# Patient Record
Sex: Male | Born: 1960 | Race: White | Hispanic: No | Marital: Married | Smoking: Former smoker
Health system: Southern US, Community
[De-identification: ages and names within clinical notes are randomized; demographics above are authoritative.]

## PROBLEM LIST (undated history)

## (undated) HISTORY — PX: NASAL ENDOSCOPY: SHX286

## (undated) HISTORY — PX: EYE SURGERY: SHX253

## (undated) HISTORY — PX: FRACTURE SURGERY: SHX138

## (undated) HISTORY — PX: VARICOCELE EXCISION: SUR582

## (undated) HISTORY — PX: ANKLE SURGERY: SHX546

---

## 2005-03-23 ENCOUNTER — Ambulatory Visit: Payer: Self-pay

## 2005-11-21 ENCOUNTER — Ambulatory Visit (HOSPITAL_BASED_OUTPATIENT_CLINIC_OR_DEPARTMENT_OTHER): Admission: RE | Admit: 2005-11-21 | Discharge: 2005-11-21 | Payer: Self-pay | Admitting: Orthopedic Surgery

## 2006-05-10 ENCOUNTER — Ambulatory Visit (HOSPITAL_COMMUNITY): Admission: RE | Admit: 2006-05-10 | Discharge: 2006-05-10 | Payer: Self-pay | Admitting: Orthopedic Surgery

## 2006-07-10 ENCOUNTER — Ambulatory Visit (HOSPITAL_BASED_OUTPATIENT_CLINIC_OR_DEPARTMENT_OTHER): Admission: RE | Admit: 2006-07-10 | Discharge: 2006-07-10 | Payer: Self-pay | Admitting: Orthopedic Surgery

## 2009-11-27 ENCOUNTER — Emergency Department: Payer: Self-pay | Admitting: Emergency Medicine

## 2009-12-05 ENCOUNTER — Emergency Department: Payer: Self-pay | Admitting: Internal Medicine

## 2015-11-10 ENCOUNTER — Emergency Department
Admission: EM | Admit: 2015-11-10 | Discharge: 2015-11-10 | Disposition: A | Discharge status: Home / Self Care | Payer: Medicaid Other | Attending: Emergency Medicine | Admitting: Emergency Medicine

## 2015-11-10 ENCOUNTER — Emergency Department: Payer: Medicaid Other

## 2015-11-10 DIAGNOSIS — G44009 Cluster headache syndrome, unspecified, not intractable: Secondary | ICD-10-CM | POA: Insufficient documentation

## 2015-11-10 DIAGNOSIS — Z87891 Personal history of nicotine dependence: Secondary | ICD-10-CM | POA: Diagnosis not present

## 2015-11-10 DIAGNOSIS — G43909 Migraine, unspecified, not intractable, without status migrainosus: Secondary | ICD-10-CM | POA: Diagnosis present

## 2015-11-10 MED ORDER — METOCLOPRAMIDE HCL 10 MG PO TABS: 10.0000 mg | ORAL_TABLET | Freq: Three times a day (TID) | ORAL | Status: DC | PRN

## 2015-11-10 MED ORDER — METOCLOPRAMIDE HCL 5 MG/ML IJ SOLN
10.0000 mg | Freq: Once | INTRAMUSCULAR | Status: AC
  Administered 2015-11-10: 10 mg via INTRAVENOUS
  Filled 2015-11-10: qty 2

## 2015-11-10 MED ORDER — SODIUM CHLORIDE 0.9 % IV BOLUS (SEPSIS)
1000.0000 mL | Freq: Once | INTRAVENOUS | Status: AC
  Administered 2015-11-10: 1000 mL via INTRAVENOUS

## 2015-11-10 MED ORDER — KETOROLAC TROMETHAMINE 30 MG/ML IJ SOLN
15.0000 mg | Freq: Once | INTRAMUSCULAR | Status: AC
  Administered 2015-11-10: 15 mg via INTRAVENOUS
  Filled 2015-11-10: qty 1

## 2015-11-10 NOTE — Discharge Instructions (Signed)
Cluster Headache Cluster headaches are recognized by their pattern of deep, intense head pain. They normally occur on one side of your head, but they may "switch sides" in subsequent episodes. Typically, cluster headaches:   Are severe in nature.   Occur repeatedly over weeks to months and are followed by periods of no headaches.   Can last from 15 minutes to 3 hours.   Occur at the same time each day, often at night.   Occur several times a day. CAUSES The exact cause of cluster headaches is not known. Alcohol use may be associated with cluster headaches. SIGNS AND SYMPTOMS   Severe pain that begins in or around your eye or temple.   One-sided head pain.   Feeling sick to your stomach (nauseous).   Sensitivity to light.   Runny nose.   Eye redness, tearing, and nasal stuffiness on the side of your head where you are experiencing pain.   Sweaty, pale skin of the face.   Droopy or swollen eyelid.   Restlessness. DIAGNOSIS  Cluster headaches are diagnosed based on symptoms and a physical exam. Your health care provider may order a CT scan or an MRI of your head or lab tests to see if your headaches are caused by other medical conditions.  TREATMENT   Medicines for pain relief and to prevent recurrent attacks. Some people may need a combination of medicines.  Oxygen for pain relief.   Biofeedback programs to help reduce headache pain.  It may be helpful to keep a headache diary. This may help you find a trend for what is triggering your headaches. Your health care provider can develop a treatment plan.  HOME CARE INSTRUCTIONS  During cluster periods:   Follow a regular sleep schedule. Do not vary the amount and time that you sleep from day to day. It is important to stay on the same schedule during a cluster period to help prevent headaches.   Avoid alcohol.   Stop smoking if you smoke.  SEEK MEDICAL CARE IF:  You have any changes from your previous  cluster headaches either in intensity or frequency.   You are not getting relief from medicines you are taking.  SEEK IMMEDIATE MEDICAL CARE IF:   You faint.   You have weakness or numbness, especially on one side of your body or face.   You have double vision.   You have nausea or vomiting that is not relieved within several hours.   You cannot keep your balance or have difficulty talking or walking.   You have neck pain or stiffness.   You have a fever. MAKE SURE YOU:  Understand these instructions.   Will watch your condition.   Will get help right away if you are not doing well or get worse.   This information is not intended to replace advice given to you by your health care provider. Make sure you discuss any questions you have with your health care provider.   Document Released: 04/24/2005 Document Revised: 02/12/2013 Document Reviewed: 11/14/2012 Elsevier Interactive Patient Education [employer]2016 [employer]  Please return immediately if condition worsens. Please contact her primary physician or the physician you were given for referral. If you have any specialist physicians involved in her treatment and plan please also contact them. Thank you for using [facility].

## 2015-11-10 NOTE — ED Notes (Signed)
[facility] with c/o migraine for the past couple of hours, states he has them multiple times a week and normally takes IBU for them.. States he feels a change in the shape of the indentations of his head

## 2015-11-10 NOTE — ED Provider Notes (Signed)
Time Seen: Approximately 1210 I have reviewed the triage notes  Chief Complaint: Migraine   History of Present Illness: Stephen Ryan is a 55 y.o. male who presents with a headache that he describes as a migraine headache. He states he's had similar headaches before in the past. He states normally takes ibuprofen and they dissipate. He's had some feelings of nausea. He still describes a sensation where he feels like the shape of the indentations of his head and have changed over the last 48 hours. Patient denies any trauma or focal weakness. He does have typical photophobia. He states he's had previous eye surgery and denies any acute visual disturbances. He denies any neck pain or difficulty with ambulation  History reviewed. No pertinent past medical history.  There are no active problems to display for this patient.   Past Surgical History  Procedure Laterality Date  . Eye surgery    . Nasal endoscopy    . Varicocele excision    . Fracture surgery      Past Surgical History  Procedure Laterality Date  . Eye surgery    . Nasal endoscopy    . Varicocele excision    . Fracture surgery      Current Outpatient Rx  Name  Route  Sig  Dispense  Refill  . metoCLOPramide (REGLAN) 10 MG tablet   Oral   Take 1 tablet (10 mg total) by mouth every 8 (eight) hours as needed for nausea.   30 tablet   1     Allergies:  Review of patient's allergies indicates no known allergies.  Family History: No family history on file.  Social History: Social History  Substance Use Topics  . Smoking status: Former Games developermoker  . Smokeless tobacco: None  . Alcohol Use: Yes     Review of Systems:   10 point review of systems was performed and was otherwise negative:  Constitutional: No fever Eyes: No visual disturbances ENT: No sore throat, ear pain Cardiac: No chest pain Respiratory: No shortness of breath, wheezing, or stridor Abdomen: No abdominal pain, no vomiting, No  diarrhea Endocrine: No weight loss, No night sweats Extremities: No peripheral edema, cyanosis Skin: No rashes, easy bruising Neurologic: No focal weakness, trouble with speech or swollowing Urologic: No dysuria, Hematuria, or urinary frequency   Physical Exam:  ED Triage Vitals  Enc Vitals Group     BP 11/10/15 1142 137/91 mmHg     Pulse Rate 11/10/15 1142 77     Resp 11/10/15 1142 18     Temp 11/10/15 1142 98.3 F (36.8 C)     Temp Source 11/10/15 1142 Oral     SpO2 11/10/15 1142 97 %     Weight 11/10/15 1142 225 lb (102.059 kg)     Height 11/10/15 1142 6' (1.829 m)     Head Cir --      Peak Flow --      Pain Score 11/10/15 1142 8     Pain Loc --      Pain Edu? --      Excl. in GC? --     General: Awake , Alert , and Oriented times 3; GCS 15 Head: Normal cephalic , atraumatic Eyes: Pupils equal , round, reactive to light Nose/Throat: No nasal drainage, patent upper airway without erythema or exudate.  Neck: Supple, Full range of motion, No anterior adenopathy or palpable thyroid masses Lungs: Clear to ascultation without wheezes , rhonchi, or rales Heart: Regular rate, regular rhythm  without murmurs , gallops , or rubs Abdomen: Soft, non tender without rebound, guarding , or rigidity; bowel sounds positive and symmetric in all 4 quadrants. No organomegaly .        Extremities: 2 plus symmetric pulses. No edema, clubbing or cyanosis Neurologic: normal ambulation, Motor symmetric without deficits, sensory intact Skin: warm, dry, no rashes   Radiology: *   CT HEAD WO CONTRAST (Final result) Result time: 11/10/15 13:14:16   Final result by Rad Results In Interface (11/10/15 13:14:16)   Narrative:   CLINICAL DATA: Headaches, severe and frequent  EXAM: CT HEAD WITHOUT CONTRAST  TECHNIQUE: Contiguous axial images were obtained from the base of the skull through the vertex without intravenous contrast.  COMPARISON: None.  FINDINGS: Brain: The ventricles are  normal in size and configuration. The right lateral ventricle is slightly larger than the left lateral ventricle, an anatomic variant. There is no intracranial mass, hemorrhage, extra-axial fluid collection, or midline shift. Gray-white compartments appear normal. No acute infarct evident.  Vascular: No vascular lesions are evident.  Skull: Bony calvarium appears intact.  Sinuses/Orbits: Visualized orbits appear symmetric bilaterally. Visualized paranasal sinuses are clear.  Other: Mastoid air cells are clear.  IMPRESSION: Study within normal limits.   Electronically Signed By: Bretta BangWilliam Woodruff III M.D. On: 11/10/2015 13:14         I personally reviewed the radiologic studies   ED Course: * Patient was given IV fluids, IV Reglan, and IV Toradol. The patient had little symptomatic relief, but declines on any other pain medication. Patient was advised to return here if there is a change in his headache pattern or focal weakness. He was referred back to his primary physician. I felt this was unlikely to be a life-threatening cause for his headache such as intracerebral masses, meningitis, encephalitis, cavernous venous thrombosis, etc.    Assessment:  Cluster headache  Final Clinical Impression:  Final diagnoses:  Cluster headache, not intractable, unspecified chronicity pattern     Plan:  Outpatient management Prescription for Reglan. (Patient declined on narcotic pain medication) Patient was advised to return immediately if condition worsens. Patient was advised to follow up with their primary care physician or other specialized physicians involved in their outpatient care. The patient and/or family member/power of attorney had laboratory results reviewed at the bedside. All questions and concerns were addressed and appropriate discharge instructions were distributed by the nursing staff.            Jennye MoccasinBrian S Quigley, MD 11/10/15 [phone]

## 2016-09-10 ENCOUNTER — Encounter: Payer: Self-pay | Admitting: Emergency Medicine

## 2016-09-10 ENCOUNTER — Emergency Department: Payer: Medicaid Other

## 2016-09-10 ENCOUNTER — Emergency Department
Admission: EM | Admit: 2016-09-10 | Discharge: 2016-09-11 | Disposition: A | Discharge status: Home / Self Care | Payer: Medicaid Other | Attending: Emergency Medicine | Admitting: Emergency Medicine

## 2016-09-10 DIAGNOSIS — M792 Neuralgia and neuritis, unspecified: Secondary | ICD-10-CM

## 2016-09-10 DIAGNOSIS — M25512 Pain in left shoulder: Secondary | ICD-10-CM | POA: Insufficient documentation

## 2016-09-10 DIAGNOSIS — Z87891 Personal history of nicotine dependence: Secondary | ICD-10-CM | POA: Insufficient documentation

## 2016-09-10 MED ORDER — ORPHENADRINE CITRATE 30 MG/ML IJ SOLN
60.0000 mg | Freq: Two times a day (BID) | INTRAMUSCULAR | Status: DC
  Administered 2016-09-10: 60 mg via INTRAMUSCULAR
  Filled 2016-09-10: qty 2

## 2016-09-10 MED ORDER — METHYLPREDNISOLONE SODIUM SUCC 125 MG IJ SOLR
125.0000 mg | Freq: Once | INTRAMUSCULAR | Status: AC
  Administered 2016-09-10: 125 mg via INTRAMUSCULAR
  Filled 2016-09-10: qty 2

## 2016-09-10 NOTE — ED Notes (Signed)
Pt says pain started about one week ago and is steadily worsening; pain at rest rated 1/10; with movement, 10/10; pt says left arm feels weak and at times he has go support it with his right arm;

## 2016-09-10 NOTE — ED Triage Notes (Signed)
Pt reports having pain in the left shoulder, shoulder blade, pain when he turns his neck to the right for the past week, pt denies injury, pt reports that occasionally when the pain is at its worse he has tingling in the left hand. Cap refill within normal limits, radial pulse palpated. Pt states that he is able to get some relief when he lays back and props the left arm up with pillows, pt also reports that this is the hand and arm used to hold  His cane and support himself

## 2016-09-11 MED ORDER — PREDNISONE 10 MG (21) PO TBPK: ORAL_TABLET | Freq: Every day | ORAL | 0 refills | Status: AC

## 2016-09-11 NOTE — ED Provider Notes (Signed)
[facility] Emergency Department Provider Note  ____________________________________________  Time seen: Approximately 4:09 PM  I have reviewed the triage vital signs and the nursing notes.   HISTORY  Chief Complaint Arm Pain    HPI Stephen Ryan is a 56 y.o. male presenting to the emergency department with 6/10 left shoulder pain that radiates to the left fingers for approximately 1 week. Patient states that he has a history of neck pain. Patient states that his symptoms are reproduced with lateral rotation at the neck. No falls or mechanisms of trauma. Patient denies weakness. Patient denies bowel or bladder incontinence. No alleviating measures have been undertaken.   History reviewed. No pertinent past medical history.  There are no active problems to display for this patient.   Past Surgical History:  Procedure Laterality Date  . ANKLE SURGERY Right    cartilage damage  . EYE SURGERY    . FRACTURE SURGERY    . NASAL ENDOSCOPY    . VARICOCELE EXCISION      Prior to Admission medications   Medication Sig Start Date End Date Taking? Authorizing Provider  predniSONE (STERAPRED UNI-PAK 21 TAB) 10 MG (21) TBPK tablet Take by mouth daily. Take 6 tablets the first day, take 5 tablets the second day, take 4 tablets the third day, take 3 tablets the fourth day, take 2 tablets the fifth day, take 1 tablet the sixth day. 09/11/16   Orvil FeilWoods, Mikal Blasdell M, PA-C    Allergies Patient has no known allergies.  History reviewed. No pertinent family history.  Social History Social History  Substance Use Topics  . Smoking status: Former Games developermoker  . Smokeless tobacco: Never Used  . Alcohol use Yes    Review of Systems  Constitutional: No fever/chills Eyes: No visual changes. No discharge ENT: No upper respiratory complaints. Cardiovascular: no chest pain. Respiratory: no cough. No SOB. Gastrointestinal: No abdominal pain.  No nausea, no vomiting.  No diarrhea.   No constipation. Musculoskeletal: Patient has left shoulder pain radiating into the left hand. Skin: Negative for rash, abrasions, lacerations, ecchymosis. Neurological: Negative for headaches, focal weakness or numbness.  ____________________________________________   PHYSICAL EXAM:  VITAL SIGNS: ED Triage Vitals  Enc Vitals Group     BP 09/10/16 2211 (!) 163/79     Pulse Rate 09/10/16 2211 73     Resp 09/10/16 2211 18     Temp 09/10/16 2211 98.2 F (36.8 C)     Temp Source 09/10/16 2211 Oral     SpO2 09/10/16 2211 98 %     Weight 09/10/16 2211 220 lb (99.8 kg)     Height 09/10/16 2211 6\' 1"  (1.854 m)     Head Circumference --      Peak Flow --      Pain Score 09/10/16 2210 10     Pain Loc --      Pain Edu? --      Excl. in GC? --      Constitutional: Alert and oriented. Well appearing and in no acute distress. Eyes: Conjunctivae are normal. PERRL. EOMI. Head: Atraumatic.  Neck: Full range of motion. Patient's left shoulder pain is reproduced with range of motion at the neck. Cardiovascular: Normal rate, regular rhythm. Normal S1 and S2.  Good peripheral circulation. Respiratory: Normal respiratory effort without tachypnea or retractions. Lungs CTAB. Good air entry to the bases with no decreased or absent breath sounds. Musculoskeletal: Patient has 5/5 strength in the upper and lower extremities bilaterally. Full range of  motion at the shoulder, elbow and wrist bilaterally. Full range of motion at the hip, knee and ankle bilaterally. No changes in gait.   Neurologic:  Normal speech and language. No gross focal neurologic deficits are appreciated. Cranial nerves: 2-10 normal as tested. Cerebellar: Finger-nose-finger WNL, heel to shin WNL. Sensorimotor: No sensory loss or abnormal reflexes. Vision: No visual field deficts noted to confrontation.  Speech: No dysarthria or expressive aphasia.  Skin:  Skin is warm, dry and intact. No rash noted. Psychiatric: Mood and affect  are normal. Speech and behavior are normal. Patient exhibits appropriate insight and judgement.   ____________________________________________   LABS (all labs ordered are listed, but only abnormal results are displayed)  Labs Reviewed - No data to display ____________________________________________  EKG   ____________________________________________  RADIOLOGY Geraldo Pitter, personally viewed and evaluated these images (plain radiographs) as part of my medical decision making, as well as reviewing the written report by the radiologist.  Dg Cervical Spine 2-3 Views  Result Date: 09/10/2016 CLINICAL DATA:  LEFT shoulder pain of when turning head for week, no injury. EXAM: CERVICAL SPINE - 2-3 VIEW COMPARISON:  None. FINDINGS: There is no evidence of cervical spine fracture or prevertebral soft tissue swelling. Intervertebral disc heights preserved with multilevel mild ventral endplate spurring. Lateral masses in alignment. Alignment is normal. Straightened cervical lordosis. No other significant bone abnormalities are identified. IMPRESSION: No fracture deformity, malalignment nor advanced degenerative change for age. Electronically Signed   By: Awilda Metro M.D.   On: 09/10/2016 23:26    ____________________________________________    PROCEDURES  Procedure(s) performed:    Procedures    Medications  methylPREDNISolone sodium succinate (SOLU-MEDROL) 125 mg/2 mL injection 125 mg (125 mg Intramuscular Given 09/10/16 2330)     ____________________________________________   INITIAL IMPRESSION / ASSESSMENT AND PLAN / ED COURSE  Pertinent labs & imaging results that were available during my care of the patient were reviewed by me and considered in my medical decision making (see chart for details).  Review of the [place] CSRS was performed in accordance of the [facility] prior to dispensing any controlled drugs.     Assessment and plan: Left shoulder pain:  Patient  presents to the emergency department with left shoulder pain that radiates to the left hand. Patient's symptoms were reproduced with range of motion at the neck. I suspect referred pain from the neck. DG cervical spine revealed no acute bony abnormality or advanced degenerative changes. Patient was given an injection of Solu-Medrol in the emergency department. He was discharged with tapered prednisone. Vital signs and physical exam are reassuring. All patient questions were answered.   ____________________________________________  FINAL CLINICAL IMPRESSION(S) / ED DIAGNOSES  Final diagnoses:  Radicular pain of left upper extremity      NEW MEDICATIONS STARTED DURING THIS VISIT:  Discharge Medication List as of 09/11/2016 12:08 AM    START taking these medications   Details  predniSONE (STERAPRED UNI-PAK 21 TAB) 10 MG (21) TBPK tablet Take by mouth daily. Take 6 tablets the first day, take 5 tablets the second day, take 4 tablets the third day, take 3 tablets the fourth day, take 2 tablets the fifth day, take 1 tablet the sixth day., Starting Mon 09/11/2016, Print            This chart was dictated using voice recognition software/Dragon. Despite best efforts to proofread, errors can occur which can change the meaning. Any change was purely unintentional.    Orvil Feil,  PA-C 09/11/16 1628    Rockne Menghini, MD 09/11/16 2244

## 2016-10-03 ENCOUNTER — Ambulatory Visit
Admission: RE | Admit: 2016-10-03 | Discharge: 2016-10-03 | Disposition: A | Discharge status: Home / Self Care | Payer: Medicaid Other | Source: Ambulatory Visit | Attending: Chiropractic Medicine | Admitting: Chiropractic Medicine

## 2016-10-03 ENCOUNTER — Other Ambulatory Visit: Payer: Self-pay | Admitting: Chiropractic Medicine

## 2016-10-03 DIAGNOSIS — M5030 Other cervical disc degeneration, unspecified cervical region: Secondary | ICD-10-CM | POA: Insufficient documentation

## 2016-10-03 DIAGNOSIS — M4802 Spinal stenosis, cervical region: Secondary | ICD-10-CM | POA: Insufficient documentation

## 2016-10-03 DIAGNOSIS — M47812 Spondylosis without myelopathy or radiculopathy, cervical region: Secondary | ICD-10-CM

## 2016-11-30 ENCOUNTER — Ambulatory Visit: Payer: Medicaid Other | Attending: Family Medicine

## 2016-11-30 DIAGNOSIS — M542 Cervicalgia: Secondary | ICD-10-CM | POA: Diagnosis not present

## 2016-11-30 DIAGNOSIS — M6281 Muscle weakness (generalized): Secondary | ICD-10-CM

## 2016-11-30 DIAGNOSIS — M5412 Radiculopathy, cervical region: Secondary | ICD-10-CM | POA: Insufficient documentation

## 2016-11-30 NOTE — Therapy (Signed)
[facility] [address], [place], [address] Phone: [phone]   Fax:  [phone]  Physical Therapy Evaluation  Patient Details  Name: Stephen Ryan MRN: [id number] Date of Birth: 04-May-1961 Referring Provider: Cruzita Lederer  Encounter Date: 11/30/2016      PT End of Session - 11/30/16 1536    Visit Number 1   Number of Visits 12   Date for PT Re-Evaluation 01/11/17   Authorization Type [employer]   PT Start Time 1430   PT Stop Time 1530   PT Time Calculation (min) 60 min   Activity Tolerance Patient tolerated treatment well   Behavior During Therapy [facility] for tasks assessed/performed      History reviewed. No pertinent past medical history.  Past Surgical History:  Procedure Laterality Date  . ANKLE SURGERY Right    cartilage damage  . EYE SURGERY    . FRACTURE SURGERY    . NASAL ENDOSCOPY    . VARICOCELE EXCISION      There were no vitals filed for this visit.       Subjective Assessment - 11/30/16 1440    Subjective Patient reports increased L sided cervical pain with symptoms raidiating into the L arm to mid forearm. Patient reports increase in pain with typing, driving, raising his arm,, moving his head (mostly to the left), and manipulation of small objects. Patient reports pain has been around the same since the onset. 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 Patient reports he uses a cane secondary to an ankle surgery he experienced a couple of years ago. Patient reports his pain is worse throughout the day. Patient reports he is under stress with not working and being able to help support his family financially. Patient reports he has a history of headaches (independent from neck pain).     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 Yes   Pain Score 4   Worst: 7/10; 3/10 at best   Pain Location Neck   Pain Orientation Left   Pain Descriptors / Indicators Aching;Radiating   Pain Type Acute pain   Pain Radiating Towards Down the mid forearm   Pain Onset More than a month ago   Pain Frequency Constant            [facility] PT Assessment - 11/30/16 1434      Assessment   Medical Diagnosis L Cervical Radiculopathy   Referring Provider Celine Mans Erin   Onset Date/Surgical Date 09/09/16   Hand Dominance Right   Next MD Visit unknown    Prior Therapy yes - ankle     Balance Screen   Has the patient fallen in the past 6 months No   Has the patient had a decrease in activity level because of a fear of falling?  No   Is the patient reluctant to leave their home because of a fear of falling?  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   Leisure bicycle riding, hiking     Cognition  Overall Cognitive Status Within Functional Limits for tasks assessed     Observation/Other Assessments   Observations Increased UT guarding on the L at rest   Other Surveys  Other Surveys   Neck Disability Index  52%     Sensation   Light Touch Impaired by gross assessment   Additional Comments Decreased Sensation on L UE: C3-C7, T1     Posture/Postural Control   Posture Comments Increased forward head posture     ROM / Strength   AROM / PROM / Strength Strength;AROM     AROM   AROM Assessment Site Cervical;Thoracic   Cervical Flexion 45   Cervical Extension 45   Cervical - Right Side Bend 20   Cervical - Left Side Bend 40   Cervical - Right Rotation 50   Cervical - Left Rotation 20   Thoracic Flexion WNL   Thoracic Extension 75% limited - pain      Strength   Strength Assessment Site Wrist;Shoulder;Elbow;Cervical   Right/Left Shoulder Right;Left   Right Shoulder ABduction 5/5   Left Shoulder ABduction 4-/5   Right/Left Elbow Right;Left   Right Elbow Flexion 5/5   Right Elbow Extension 5/5   Left Elbow Flexion 4-/5   Left Elbow Extension 4-/5   Right/Left Wrist Left;Right   Right Wrist Flexion 5/5   Right Wrist Extension 5/5   Left Wrist Flexion 4/5   Left Wrist Extension 4/5   Cervical Flexion 4+/5   Cervical Extension 4/5   Cervical - Right Side Bend 5/5   Cervical - Left Side Bend 4/5   Cervical - Right Rotation 4/5   Cervical - Left Rotation 4-/5     Palpation   Spinal mobility Increased hypomobility and pain with C5-T6 centrally and unilaterally   Palpation comment Increased TTP along scalenes and UT     Special Tests    Special Tests Cervical   Cervical Tests Spurling's;Dictraction     Spurling's   Findings Positive   Side Left   Comment pain     Distraction Test   Findngs Positive   side Left   Comment decrease in pain      Objective measurements completed on examination: See above findings.   HEP:  Scapular/cervical retraction in standing -- x15 Cervical lateral flexion isometrics -- x 15  Patient demonstrates improvement of L cervical rotation from 20 to 45deg at end of session        PT Education - 11/30/16 1534    Education provided Yes   Education Details HEP: cervical retraction, sidebending isometrics, scapular retraction   Person(s) Educated Patient   Methods Explanation;Demonstration   Comprehension Verbalized understanding;Returned demonstration             PT Long Term Goals - 11/30/16 1544      PT LONG TERM GOAL #1   Title Patient will be independent with HEP to continue benefits of therapy after discharge.   Baseline Dependent with form/technique   Time 6   Period Weeks   Status New     PT LONG TERM GOAL #2   Title Patient will improve NDI to under 20% to  indicate significant improvement with neck pain with performing functional tasks   Baseline 52% NDI   Time 6   Period Weeks   Status New     PT LONG TERM GOAL #3   Title Patient will be able to type pain free for >1 hour or greater be able to perform occupational tasks.  Baseline Unable to type without pain   Time 6   Period Weeks   Status New                Plan - 11/30/16 1536    Clinical Impression Statement Patient is a 56 yo right hand dominant male presenting with increased L sided neck pain with radiating symptoms down the L UE from idiopathetic onset. Patient's cervical dysfunction is indicated by increased NDI score and postive spurlings/distraction test on the affected side. Patient also demonstrate increased weakness throuhgout the L UE indicating increased cervical dysfunction. Patient will benefit from further skilled therapy focused on improving muscular strength, coordination, and endurance to return to prior level of function.    History and Personal Factors relevant to plan of care: History of neck pain   Clinical Presentation Evolving   Clinical Presentation due to: NO increase of pain   Clinical Decision Making Low   Rehab Potential Good   Clinical Impairments Affecting Rehab Potential (+) highly motivated (-) chronic pain   PT Frequency 2x / week   PT Duration 6 weeks   PT Treatment/Interventions Iontophoresis 4mg /ml Dexamethasone;Ultrasound;Moist Heat;Traction;Cryotherapy;Microbiologistlectrical Stimulation;Balance training;Therapeutic exercise;Therapeutic activities;Functional mobility training;Neuromuscular re-education;Patient/family education;Manual techniques;Dry needling   PT Next Visit Plan Progress neck strengthening and improving coordination   Consulted and Agree with Plan of Care Patient      Patient will benefit from skilled therapeutic intervention in order to improve the following deficits and impairments:  Decreased endurance, Pain, Impaired sensation,  Increased fascial restricitons, Decreased mobility, Decreased coordination, Increased muscle spasms, Decreased strength, Decreased range of motion, Hypomobility  Visit Diagnosis: Cervicalgia - Plan: PT plan of care cert/re-cert  Muscle weakness (generalized) - Plan: PT plan of care cert/re-cert  Radiculopathy, cervical region - Plan: PT plan of care cert/re-cert     Problem List There are no active problems to display for this patient.   Myrene GalasWesley Mita Vallo, PT DPT 11/30/2016, 4:03 PM  [facility] [address] [place], [name], [address] Phone: [phone]   Fax:  [phone]  Name: Kennieth FrancoisKenneth Vanwinkle MRN: [id number] Date of Birth: 12/23/1960

## 2016-12-19 ENCOUNTER — Ambulatory Visit: Payer: Medicaid Other | Attending: Family Medicine

## 2016-12-19 DIAGNOSIS — M6281 Muscle weakness (generalized): Secondary | ICD-10-CM | POA: Diagnosis present

## 2016-12-19 DIAGNOSIS — M542 Cervicalgia: Secondary | ICD-10-CM | POA: Diagnosis present

## 2016-12-19 NOTE — Therapy (Signed)
[facility] [address] [place], [name], [address] Phone: [phone]   Fax:  [phone]  Physical Therapy Treatment  Patient Details  Name: Stephen Ryan MRN: [id number] Date of Birth: 01/14/1961 Referring Provider: Cruzita LedererJudson, Melissa Erin  Encounter Date: 12/19/2016      PT End of Session - 12/19/16 1606    Visit Number 2   Number of Visits 12   Date for PT Re-Evaluation 01/11/17   Authorization Type [employer]   PT Start Time 1515   PT Stop Time 1600   PT Time Calculation (min) 45 min   Activity Tolerance Patient tolerated treatment well   Behavior [facility] assessed/performed      History reviewed. No pertinent past medical history.  Past Surgical History:  Procedure Laterality Date  . ANKLE SURGERY Right    cartilage damage  . EYE SURGERY    . FRACTURE SURGERY    . NASAL ENDOSCOPY    . VARICOCELE EXCISION      There were no vitals filed for this visit.      Subjective Assessment - 12/19/16 1519    Subjective Patient reports he continues to have increased neck pain. Patient reports pain on the top of the shoulder/neck has been getting a little worse. Patient states he's currently not driving for long distances secondary to increased neck pain.    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 Yes   Pain Score 5    Pain Location Neck   Pain Orientation Left   Pain Descriptors / Indicators Aching   Pain Type Acute pain   Pain Onset More than a month ago      TREATMENT  Manual Therapy:  STM to patient's cervical extensors, upper trap, thoracic extensors, and scapular retractors with patient positioned in prone to decrease pain and spasms. A-P mobilizations C2-T6 central and  unilaterally on the L side - 2 x 30sec   Therapeutic Exercise:  Seated scapular retraction in sitting - x20  Seated cervical retraction - x10 Seated isometric cervical side bending isometrics - x 10 3 sec holds Thoracic Extension over the towel - 2x 10 Prayer stretch with Clear physioball -- x20 with deep inspiration at end of position Standing scapular retraction with RTB - x20  Patient demonstrates no increase in pain throughout session        PT Education - 12/19/16 1605    Education provided Yes   Education Details HEP: scapular B shoulder ER with scapular retraction   Person(s) Educated Patient   Methods Explanation;Demonstration   Comprehension Verbalized understanding;Returned demonstration             PT Long Term Goals - 11/30/16 1544      PT LONG TERM GOAL #1   Title Patient will be independent with HEP to continue benefits of therapy after discharge.   Baseline Dependent with form/technique   Time 6   Period Weeks   Status New     PT LONG TERM GOAL #2   Title Patient will improve NDI to under 20% to indicate significant improvement with neck pain with performing functional tasks   Baseline 52% NDI   Time 6   Period Weeks   Status New     PT LONG TERM  GOAL #3   Title Patient will be able to type pain free for >1 hour or greater be able to perform occupational tasks.    Baseline Unable to type without pain   Time 6   Period Weeks   Status New               Plan - 12/19/16 1609    Clinical Impression Statement Patient demonstrates increased aggravation upon palpation  (central, L unilateral P-As) along the thoracic spine which reproduced symptoms indicating thoracic/cervical dysfunction. Patient demonstrates no increase in symptoms throughout session but demonstrates increased muscular fatigue at end of session indicating decreased muscular endurance. Patient will benefit from further skilled therapy to return to prior level of function.      Rehab Potential Good   Clinical Impairments Affecting Rehab Potential (+) highly motivated (-) chronic pain   PT Frequency 2x / week   PT Duration 6 weeks   PT Treatment/Interventions Iontophoresis 4mg /ml Dexamethasone;Ultrasound;Moist Heat;Traction;Cryotherapy;Microbiologist;Therapeutic exercise;Therapeutic activities;Functional mobility training;Neuromuscular re-education;Patient/family education;Manual techniques;Dry needling   PT Next Visit Plan Progress neck strengthening and improving coordination   Consulted and Agree with Plan of Care Patient      Patient will benefit from skilled therapeutic intervention in order to improve the following deficits and impairments:  Decreased endurance, Pain, Impaired sensation, Increased fascial restricitons, Decreased mobility, Decreased coordination, Increased muscle spasms, Decreased strength, Decreased range of motion, Hypomobility  Visit Diagnosis: Cervicalgia  Muscle weakness (generalized)     Problem List There are no active problems to display for this patient.   Myrene Galas, PT DPT 12/19/2016, 4:43 PM  [facility] SPORTS MEDICINE [address], [place], [address] Phone: [phone]   Fax:  [phone]  Name: Stephen Ryan MRN: [id number] Date of Birth: 04-Nov-1960

## 2017-01-11 ENCOUNTER — Ambulatory Visit: Payer: Medicaid Other | Attending: Family Medicine

## 2017-01-11 DIAGNOSIS — M5412 Radiculopathy, cervical region: Secondary | ICD-10-CM | POA: Insufficient documentation

## 2017-01-11 DIAGNOSIS — M542 Cervicalgia: Secondary | ICD-10-CM | POA: Diagnosis present

## 2017-01-11 DIAGNOSIS — M6281 Muscle weakness (generalized): Secondary | ICD-10-CM | POA: Insufficient documentation

## 2017-01-11 NOTE — Therapy (Signed)
[facility] [address] [place], [name], [address] Phone: [phone]   Fax:  [phone]  Physical Therapy Treatment  Patient Details  Name: Stephen Ryan MRN: [id number] Date of Birth: 08/20/1960 Referring Provider: Cruzita LedererJudson, Melissa Erin  Encounter Date: 01/11/2017      PT End of Session - 01/11/17 1312    Visit Number 3   Number of Visits 12   Date for PT Re-Evaluation 01/11/17   Authorization Type [employer]   PT Start Time 1300   PT Stop Time 1345   PT Time Calculation (min) 45 min   Activity Tolerance Patient tolerated treatment well   Behavior [facility] Therapy [facility] for tasks assessed/performed      History reviewed. No pertinent past medical history.  Past Surgical History:  Procedure Laterality Date  . ANKLE SURGERY Right    cartilage damage  . EYE SURGERY    . FRACTURE SURGERY    . NASAL ENDOSCOPY    . VARICOCELE EXCISION      There were no vitals filed for this visit.      Subjective Assessment - 01/11/17 1108    Subjective Patient reports he began driving and using laptop has increased neck pain when performing. Patient reports he can drive for 20min before drastic increase in pain. Patient reports he can use laptop for 15-20 min before increase in pain.    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 Yes   Pain Score 5    Pain Location Neck   Pain Orientation Left;Right   Pain Descriptors / Indicators Aching   Pain Type Acute pain   Pain Onset More than a month ago   Pain Frequency Constant        TREATMENT  Manual Therapy:  STM to patient's cervical extensors, upper trap, thoracic extensors, and scapular retractors with patient positioned in prone to decrease pain and spasms. P-A  mobilizations C2-C7 central and unilaterally on the L side - 2 x 30sec with patient in supine    Therapeutic Exercise:   Prayer stretch with Clear physioball -- x20 with deep inspiration at end of position Cervical Isometrics in standing - x25 with 3 sec holds SNAGs for Rotation - x 15 B Self-inferior mobilization with a towel to 1st rib on the L - x10  Thoracic rotation in sitting - x 15 B  Isometrics cervical retraction in standing -- x15 Isometrics lateral side bending against wall  -- x 15 B    Patient demonstrates no increase in pain throughout session       PT Education - 01/11/17 1143    Education provided Yes   Education Details HEP: SNAG   Person(s) Educated Patient   Methods Explanation;Demonstration   Comprehension Verbalized understanding;Returned demonstration             PT Long Term Goals - 11/30/16 1544      PT LONG TERM GOAL #1   Title Patient will be independent with HEP to continue benefits of therapy after discharge.   Baseline Dependent with form/technique   Time 6   Period Weeks   Status New     PT LONG TERM GOAL #2   Title Patient will improve NDI to under 20% to indicate significant improvement with neck pain with  performing functional tasks   Baseline 52% NDI   Time 6   Period Weeks   Status New     PT LONG TERM GOAL #3   Title Patient will be able to type pain free for >1 hour or greater be able to perform occupational tasks.    Baseline Unable to type without pain   Time 6   Period Weeks   Status New               Plan - 01/11/17 1313    Clinical Impression Statement Patient demonstrates decreased pain and spasms after performing manual therapy indicating improved joint motion and muscular elasticity. Patient continues to demonstrate increased guarding with exercises and performing cervical motions indicating poor motor control. Patient requires frequent verbal and tactile cueing to perform exercises, further indicating poor  motor control. Patient will benefit from further skilled therapy  to return  prior level of function.     Rehab Potential Good   Clinical Impairments Affecting Rehab Potential (+) highly motivated (-) chronic pain   PT Frequency 2x / week   PT Duration 6 weeks   PT Treatment/Interventions Iontophoresis /ml Dexamethasone;Ultrasound;Moist Heat;Traction;Cryotherapy;Microbiologist;Therapeutic exercise;Therapeutic activities;Functional mobility training;Neuromuscular re-education;Patient/family education;Manual techniques;Dry needling   PT Next Visit Plan Progress neck strengthening and improving coordination   Consulted and Agree with Plan of Care Patient      Patient will benefit from skilled therapeutic intervention in order to improve the following deficits and impairments:  Decreased endurance, Pain, Impaired sensation, Increased fascial restricitons, Decreased mobility, Decreased coordination, Increased muscle spasms, Decreased strength, Decreased range of motion, Hypomobility  Visit Diagnosis: Cervicalgia  Muscle weakness (generalized)  Radiculopathy, cervical region     Problem List There are no active problems to display for this patient.   Myrene Galas, PT DPT 01/11/2017, 1:30 PM  [facility] SPORTS [facility]IC[facility] [address] [place], [address] Phone: [phone]   Fax:  [phone]  Name: Stephen Ryan MRN: [id number] Date of Birth: February 27, 1961

## 2017-01-17 ENCOUNTER — Ambulatory Visit: Payer: Medicaid Other

## 2017-01-17 DIAGNOSIS — M542 Cervicalgia: Secondary | ICD-10-CM | POA: Diagnosis not present

## 2017-01-17 DIAGNOSIS — M6281 Muscle weakness (generalized): Secondary | ICD-10-CM

## 2017-01-17 DIAGNOSIS — M5412 Radiculopathy, cervical region: Secondary | ICD-10-CM
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
## 2017-01-17 NOTE — Addendum Note (Signed)
Addended by: Bethanie DickerISSELL, [place] V on: 01/17/2017 12:16 PM   Modules accepted: Orders

## 2017-01-22 ENCOUNTER — Ambulatory Visit: Payer: Medicaid Other

## 2017-01-22 DIAGNOSIS — M542 Cervicalgia: Secondary | ICD-10-CM

## 2017-01-22 DIAGNOSIS — M5412 Radiculopathy, cervical region: Secondary | ICD-10-CM

## 2017-01-22 DIAGNOSIS — M6281 Muscle weakness (generalized): Secondary | ICD-10-CM

## 2017-01-22 NOTE — Therapy (Signed)
[facility] [address] [place], [address] Phone: [phone]   Fax:  [phone]  Physical Therapy Treatment  Patient Details  Name: Stephen Ryan MRN: [id number] Date of Birth: 12-01-1960 Referring Provider: Cruzita Lederer  Encounter Date: 01/22/2017      PT End of Session - 01/22/17 1455    Visit Number 5   Number of Visits 12   Date for PT Re-Evaluation 02/16/17   Authorization Type [employer]   PT Start Time 1420   PT Stop Time 1505   PT Time Calculation (min) 45 min   Activity Tolerance Patient tolerated treatment well   Behavior During Therapy [facility] for tasks assessed/performed      History reviewed. No pertinent past medical history.  Past Surgical History:  Procedure Laterality Date  . ANKLE SURGERY Right    cartilage damage  . EYE SURGERY    . FRACTURE SURGERY    . NASAL ENDOSCOPY    . VARICOCELE EXCISION      There were no vitals filed for this visit.      Subjective Assessment - 01/22/17 1448    Subjective Patient reports he continues to have increased pain with activity such as typing. Patient reports he did not drive here today.    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 Yes   Pain Score 4    Pain Location Neck   Pain Orientation Right;Left   Pain Descriptors / Indicators Aching   Pain Type Acute pain   Pain Onset More than a month ago   Pain Frequency Constant      TREATMENT:   Dry Needling: 4 dry needles placed into the R upper trap with the patient positioned in supine to decrease pain and spasms in the area. Patient demonstrates several local muscular twitch responses after needle placement. Patient verbally consents to treatment.    Therapeutic Exercise High  row in sitting at [facility] - x 20 #25 Retractions in sitting at [facility] - x25 10# AAROM in standing for shoulder flexion in standing - x 20  Push up PLUS in standing - x 20  Arms behind the back scapular retraction - x 20  Standing Shoulder ER in standing - x 20 Shoulder flexion with forearms at wall -- x 20    Manual Therapy: STM to patient R upper trap, thoracic extensors, cervical extensors utilizing  deep and superficial techniques with patient positioned in supine to decrease increased pain and decrease muscular spasms.         PT Education - 01/22/17 1454    Education provided Yes   Education Details form/technique with exercise    Person(s) Educated Patient   Methods Explanation;Demonstration   Comprehension Verbalized understanding;Returned demonstration             PT Long Term Goals - 01/17/17 1149      PT LONG TERM GOAL #1   Title Patient will be independent with HEP to continue benefits of therapy after discharge.   Baseline Dependent with form/technique; 01/17/17: Moderate cueing on technique/form   Time 6   Period Weeks   Status On-going     PT LONG TERM GOAL #2   Title Patient will improve NDI to under 20% to indicate significant improvement with neck  pain with performing functional tasks   Baseline 52% NDI; 01/17/17: 38%   Time 6   Period Weeks   Status On-going     PT LONG TERM GOAL #3   Title Patient will be able to type pain free for >1 hour or greater be able to perform occupational tasks.    Baseline Unable to type without pain; 01/17/17: Can type for 15-2min before onset of pain   Time 6   Period Weeks   Status On-going               Plan - 01/22/17 1509    Clinical Impression Statement Patient demonstrates improvement with exercise performance with ability to perform greater amount of repetitions and throuhgout greater AROM compared to previous visits indicating functional carryover between sessions. Patient continues to demonstrate  decreased strength and coordination requiring verbal and tactile cueing to perform exercises. Patient will benefit from further skilled therapy to return to prior level of function.     Rehab Potential Good   Clinical Impairments Affecting Rehab Potential (+) highly motivated (-) chronic pain   PT Frequency 2x / week   PT Duration 6 weeks   PT Treatment/Interventions Iontophoresis /ml Dexamethasone;Ultrasound;Moist Heat;Traction;Cryotherapy;Microbiologist;Therapeutic exercise;Therapeutic activities;Functional mobility training;Neuromuscular re-education;Patient/family education;Manual techniques;Dry needling   PT Next Visit Plan Progress neck strengthening and improving coordination   Consulted and Agree with Plan of Care Patient      Patient will benefit from skilled therapeutic intervention in order to improve the following deficits and impairments:  Decreased endurance, Pain, Impaired sensation, Increased fascial restricitons, Decreased mobility, Decreased coordination, Increased muscle spasms, Decreased strength, Decreased range of motion, Hypomobility  Visit Diagnosis: Cervicalgia  Muscle weakness (generalized)  Radiculopathy, cervical region     Problem List There are no active problems to display for this patient.   Myrene Galas, PT DPT 01/22/2017, 3:30 PM  [facility] PHYS[facility] AND SPORTS MEDICINE [address], [place], [address] Phone: [phone]   Fax:  [phone]  Name: Stephen Ryan MRN: [id number] Date of Birth: Jun 29, 1960

## 2017-02-28 ENCOUNTER — Ambulatory Visit: Payer: Medicaid Other | Attending: Family Medicine

## 2017-02-28 DIAGNOSIS — M6281 Muscle weakness (generalized): Secondary | ICD-10-CM | POA: Diagnosis present

## 2017-02-28 DIAGNOSIS — M542 Cervicalgia: Secondary | ICD-10-CM

## 2017-02-28 DIAGNOSIS — M5412 Radiculopathy, cervical region: Secondary | ICD-10-CM | POA: Diagnosis present

## 2017-02-28 NOTE — Addendum Note (Signed)
Addended by: Bethanie DickerISSELL, [place] V on: 02/28/2017 03:33 PM   Modules accepted: Orders

## 2017-02-28 NOTE — Therapy (Signed)
[facility] [address], [place], [address] Phone: [phone]   Fax:  [phone]  Physical Therapy Treatment  Patient Details  Name: Stephen Ryan MRN: [id number] Date of Birth: Apr 20, 1961 Referring Provider: Cruzita Lederer  Encounter Date: 02/28/2017      PT End of Session - 02/28/17 1254    Visit Number 6   Number of Visits 12   Date for PT Re-Evaluation 03/28/17   Authorization Type [employer]   PT Start Time 1117   PT Stop Time 1203   PT Time Calculation (min) 46 min   Activity Tolerance Patient tolerated treatment well   Behavior During Therapy [facility] for tasks assessed/performed      History reviewed. No pertinent past medical history.  Past Surgical History:  Procedure Laterality Date  . ANKLE SURGERY Right    cartilage damage  . EYE SURGERY    . FRACTURE SURGERY    . NASAL ENDOSCOPY    . VARICOCELE EXCISION      There were no vitals filed for this visit.      Subjective Assessment - 02/28/17 1249    Subjective Patient reports his pain is improving but continues to have increased pain with typing and driving. Patient states he's been performing exercises at home. Patient states he is returning to work this Sunday.    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 Yes   Pain Score 4    Pain Location Neck   Pain Orientation Left;Right   Pain Descriptors / Indicators Aching   Pain Type Acute pain   Pain Onset More than a month ago   Pain Frequency Constant      Dry Needling:(60min unbilled) 3 dry needles placed into the L upper trap with the patient positioned in supine to decrease pain and spasms in the area. Patient demonstrates several local muscular twitch responses after needle  placement. Patient verbally consents to treatment.   Therapeutic Exercise Retractions in sitting at [facility] - x25 10# Arms behind the back scapular retraction - x 20  Straight push downs at [facility] -- x 20  Seated chest press x 20 with 5#   Manual Therapy: STM to patient R upper trap, thoracic extensors, cervical extensors utilizing  deep and superficial techniques with patient positioned in supine to decrease increased pain and decrease muscular spasms.        PT Education - 02/28/17 1253    Education provided Yes   Education Details Form/technique with exercise   Person(s) Educated Patient   Methods Explanation;Demonstration   Comprehension Verbalized understanding;Returned demonstration             PT Long Term Goals - 02/28/17 1255      PT LONG TERM GOAL #1   Title Patient will be independent with HEP to continue benefits of therapy after discharge.   Baseline Dependent with form/technique; 01/17/17: Moderate cueing on technique/form; 02/28/17: moderate cueing and form   Time 6   Period Weeks   Status On-going     PT LONG TERM GOAL #2   Title Patient will improve NDI to under 20% to indicate significant improvement with neck pain with performing functional tasks   Baseline 52% NDI; 01/17/17: 38%; 02/28/2017: 34%   Time 6   Period  Weeks   Status On-going     PT LONG TERM GOAL #3   Title Patient will be able to type pain free for >1 hour or greater be able to perform occupational tasks.    Baseline Unable to type without pain; 01/17/17: Can type for 15-6620min before onset of pain; 02/28/17: can type ~ 20 min before onset of pain   Time 6   Period Weeks   Status On-going               Plan - 02/28/17 1256    Clinical Impression Statement Patient is making progress towards long term goals with greater ability to perform certain functional exercises such as typing; however, he continues to have pain with these activites limiting ability to perform ADLs without  increase in pain. Patient demonstrates improvement with exercises requiring less cueing for proper technique and performance. Patient will benefit from further skilled therapy to return to prior level of function.    Rehab Potential Good   Clinical Impairments Affecting Rehab Potential (+) highly motivated (-) chronic pain   PT Frequency 2x / week   PT Duration 6 weeks   PT Treatment/Interventions Iontophoresis 4mg /ml Dexamethasone;Ultrasound;Moist Heat;Traction;Cryotherapy;Microbiologistlectrical Stimulation;Balance training;Therapeutic exercise;Therapeutic activities;Functional mobility training;Neuromuscular re-education;Patient/family education;Manual techniques;Dry needling   PT Next Visit Plan Progress neck strengthening and improving coordination   Consulted and Agree with Plan of Care Patient      Patient will benefit from skilled therapeutic intervention in order to improve the following deficits and impairments:  Decreased endurance, Pain, Impaired sensation, Increased fascial restricitons, Decreased mobility, Decreased coordination, Increased muscle spasms, Decreased strength, Decreased range of motion, Hypomobility  Visit Diagnosis: Muscle weakness (generalized)  Radiculopathy, cervical region  Cervicalgia     Problem List There are no active problems to display for this patient.   Myrene GalasWesley Stepheni Cameron, PT DPT 02/28/2017, 12:58 PM  [facility] [address] [place], [name], [address] Phone: [phone]   Fax:  [phone]  Name: Stephen Ryan MRN: [id number] Date of Birth: 08/14/1960

## 2017-03-22 ENCOUNTER — Ambulatory Visit: Payer: Medicaid Other | Attending: Family Medicine

## 2017-03-22 DIAGNOSIS — M6281 Muscle weakness (generalized): Secondary | ICD-10-CM | POA: Insufficient documentation

## 2017-03-22 DIAGNOSIS — M542 Cervicalgia: Secondary | ICD-10-CM | POA: Diagnosis present

## 2017-03-22 DIAGNOSIS — M5412 Radiculopathy, cervical region: Secondary | ICD-10-CM | POA: Diagnosis present

## 2017-03-22 NOTE — Therapy (Signed)
[facility] [address] [place], [name], [address] Phone: [phone]   Fax:  [phone]  Physical Therapy Treatment  Patient Details  Name: Stephen Ryan MRN: [id number] Date of Birth: 02/25/1961 Referring Provider: Cruzita LedererJudson, Melissa Erin   Encounter Date: 03/22/2017  PT End of Session - 03/22/17 1241    Visit Number  7    Number of Visits  12    Date for PT Re-Evaluation  03/28/17    Authorization Type  [employer]    PT Start Time  1115    PT Stop Time  1215    PT Time Calculation (min)  60 min    Activity Tolerance  Patient tolerated treatment well    Behavior During Therapy  [facility] for tasks assessed/performed       History reviewed. No pertinent past medical history.  Past Surgical History:  Procedure Laterality Date  . ANKLE SURGERY Right    cartilage damage  . EYE SURGERY    . FRACTURE SURGERY    . NASAL ENDOSCOPY    . VARICOCELE EXCISION      There were no vitals filed for this visit.  Subjective Assessment - 03/22/17 1120    Subjective  Patient reports his pain has been worse over the past 3 weeks but reports physical therapy has been helping with his pain. Patient states he was able to work for 3 days but had to stop secondary to pain.     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  Yes    Pain Score  6     Pain Location  Neck    Pain Orientation  Left;Right    Pain Descriptors / Indicators  Aching    Pain Type  Chronic pain    Pain Onset  More than a month ago    Pain Frequency  Constant         TREATMENT  Manual Therapy: STM to patient R upper trap, thoracic extensors, cervical extensors utilizing deep and superficial techniques with patient positioned in supine to decrease increased  pain and decrease muscular spasms.   Dry Needling: (8min unbilled) 3 dry needles placed into B upper trap with the patient positioned in supine to decrease pain and spasms in the area. Patient demonstrates several local muscular twitch responses after needle placement. Patient verbally consents to treatment.  Therapeutic Exercise: Standing scapular retraction with arms behind her back - x 10  Modalities (23min) One Large moist heat pad placed along the right shoulder with patient positioned in sitting to decrease pain and spasms within her upper trap musculature. High volt placed along upper trap on the R and L side to decrease increased pain and spasms; two large pads on the R and left side. Patient educated on treatment and verbally consents to treatment. Skin checked without any negative changes noted.   PT Education - 03/22/17 1238    Education provided  Yes    Education Details  form/technique with exercise; Arms behind back scapular retraction    Person(s) Educated  Patient    Methods  Explanation;Demonstration    Comprehension  Verbalized understanding;Returned demonstration          PT Long Term Goals - 03/22/17 1159  PT LONG TERM GOAL #1   Title  Patient will be independent with HEP to continue benefits of therapy after discharge.    Baseline  Dependent with form/technique; 01/17/17: Moderate cueing on technique/form; 02/28/17: moderate cueing and form; 03/22/2017: moderate cueing and form    Time  6    Period  Weeks    Status  On-going      PT LONG TERM GOAL #2   Title  Patient will improve NDI to under 20% to indicate significant improvement with neck pain with performing functional tasks    Baseline  52% NDI; 01/17/17: 38%; 02/28/2017: 34%; 03/22/2017: 50%    Time  6    Period  Weeks    Status  On-going      PT LONG TERM GOAL #3   Title  Patient will be able to type pain free for >1 hour or greater be able to perform occupational tasks.     Baseline  Unable to  type without pain; 01/17/17: Can type for 15-2520min before onset of pain; 02/28/17: can type ~ 20 min before onset of pain; 03/22/2017: 45min    Time  6    Period  Weeks    Status  On-going            Plan - 03/22/17 1241    Clinical Impression Statement  Patient has overall worsening of symptoms since the previous goal setting session. Patient demonstrates a worsening of his NDI but is able to perform computer activities for longer periods of time, indicating minor improvement. Patient demonstrates limited improvement most likely from decreased ability to continue therapy consistently. Patient will benefit from further skilled therapy focused on improving limitations to return to prior level of function.     Rehab Potential  Good    Clinical Impairments Affecting Rehab Potential  (+) highly motivated (-) chronic pain    PT Frequency  2x / week    PT Duration  6 weeks    PT Treatment/Interventions  Iontophoresis 4mg /ml Dexamethasone;Ultrasound;Moist Heat;Traction;Cryotherapy;Microbiologistlectrical Stimulation;Balance training;Therapeutic exercise;Therapeutic activities;Functional mobility training;Neuromuscular re-education;Patient/family education;Manual techniques;Dry needling    PT Next Visit Plan  Progress neck strengthening and improving coordination    Consulted and Agree with Plan of Care  Patient       Patient will benefit from skilled therapeutic intervention in order to improve the following deficits and impairments:  Decreased endurance, Pain, Impaired sensation, Increased fascial restricitons, Decreased mobility, Decreased coordination, Increased muscle spasms, Decreased strength, Decreased range of motion, Hypomobility  Visit Diagnosis: Muscle weakness (generalized)  Radiculopathy, cervical region  Cervicalgia     Problem List There are no active problems to display for this patient.   Myrene GalasWesley Taccara Bushnell, PT DPT 03/22/2017, 12:58 PM  [facility]  PHYS[facility] SPORTS MEDICINE [address] [place], [name], [address] Phone: [phone]   Fax:  [phone]  Name: Stephen Ryan MRN: [id number] Date of Birth: 06/06/1960

## 2017-04-05 ENCOUNTER — Ambulatory Visit: Payer: Medicaid Other

## 2017-04-05 DIAGNOSIS — M542 Cervicalgia: Secondary | ICD-10-CM

## 2017-04-05 DIAGNOSIS — M6281 Muscle weakness (generalized): Secondary | ICD-10-CM

## 2017-04-05 DIAGNOSIS — M5412 Radiculopathy, cervical region: Secondary | ICD-10-CM

## 2017-04-05 NOTE — Addendum Note (Signed)
Addended by: Bethanie DickerISSELL, [place] V on: 04/05/2017 04:59 PM   Modules accepted: Orders

## 2017-04-05 NOTE — Therapy (Signed)
Stephen Ryan [address] [place], [name], [address] Phone: [phone]   Fax:  [phone]  Physical Therapy Treatment  Patient Details  Name: Stephen Ryan MRN: [id number] Date of Birth: 03/10/1961 Referring Provider: Cruzita LedererJudson, Melissa Ryan   Encounter Date: 04/05/2017  PT End of Session - 04/05/17 1156    Visit Number  8    Number of Visits  12    Date for PT Re-Evaluation  05/03/17    Authorization Type  [employer]    PT Start Time  0945    PT Stop Time  1030    PT Time Calculation (min)  45 min    Activity Tolerance  Patient tolerated treatment well    Behavior During Therapy  [facility] for tasks assessed/performed       History reviewed. No pertinent past medical history.  Past Surgical History:  Procedure Laterality Date  . ANKLE SURGERY Right    cartilage damage  . EYE SURGERY    . FRACTURE SURGERY    . NASAL ENDOSCOPY    . VARICOCELE EXCISION      There were no vitals filed for this visit.  Subjective Assessment - 04/05/17 1120    Subjective  Patient reports he went to an orthopedic surgeon the other day secondary to the pain in which they talked about surgical options.     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  Yes    Pain Score  4     Pain Location  Neck    Pain Orientation  Right;Left    Pain Descriptors / Indicators  Aching    Pain Onset  More than a month ago       TREATMENT Therapeutic Exercise: (All exercises performed with coordinated breathing) Scapular retraction in supine - x 15 with cueing to decrease shoulder elevation  Scapular retraction with cervical retraction in sitting - x 15 with cueing to decrease SNAG cervical rotation in sitting with use of towel - x 10 B  Behind the  back scapular retraction - x 7  Radial nerve mobilizations - x 15 in sitting  Cervical isometrics side bending - 5 sec holds x30  Manual Therapy: STM to patient's cervical extensors and retractors with patient positioned in supine to decrease increased pain and spasms in the area.  Cervical mobilizations grade II-III to decrease pain along the area and improve mobilization - 3 x 30 sec on the R and L side C3-6   Patient reports a rest pain of a 3/10 after session   PT Education - 04/05/17 1156    Education provided  Yes    Education Details  HEP: SNAGS    Person(s) Educated  Patient    Methods  Explanation;Demonstration    Comprehension  Verbalized understanding;Returned demonstration          PT Long Term Goals - 04/05/17 1159      PT LONG TERM GOAL #1   Title  Patient will be independent with HEP to continue benefits of therapy after discharge.    Baseline  Dependent with form/technique; 01/17/17: Moderate cueing on technique/form; 02/28/17: moderate cueing and form; 03/22/2017: moderate cueing and form    Time  6    Period  Weeks  Status  On-going      PT LONG TERM GOAL #2   Title  Patient will improve NDI to under 20% to indicate significant improvement with neck pain with performing functional tasks    Baseline  52% NDI; 01/17/17: 38%; 02/28/2017: 34%; 03/22/2017: 50%; 04/05/2017: 46%    Time  6    Period  Weeks    Status  On-going      PT LONG TERM GOAL #3   Title  Patient will be able to type pain free for >1 hour or greater be able to perform occupational tasks.     Baseline  Unable to type without pain; 01/17/17: Can type for 15-7120min before onset of pain; 02/28/17: can type ~ 20 min before onset of pain; 03/22/2017: 45min; 04/05/2017    Time  6    Period  Weeks    Status  On-going            Plan - 04/05/17 1157    Clinical Impression Statement  Patient continues to have increased pain at rest. Patient has increased symptoms along radial nerve pattern  dermatomes with increased pain after performing radial nerve tension testing. Conitnued to focus on improving scapular and cervical retraction in weight bearing and non weighting positions to improve motor control and decrease pain. Patient reports a decrease of pain of 3 / 10 to return to prior level of funciton. Patient will benefit from further skilled therapy to return to prior level of function.     Rehab Potential  Good    Clinical Impairments Affecting Rehab Potential  (+) highly motivated (-) chronic pain    PT Frequency  2x / week    PT Duration  6 weeks    PT Treatment/Interventions  Iontophoresis 4mg /ml Dexamethasone;Ultrasound;Moist Heat;Traction;Cryotherapy;Microbiologistlectrical Stimulation;Balance training;Therapeutic exercise;Therapeutic activities;Functional mobility training;Neuromuscular re-education;Patient/family education;Manual techniques;Dry needling    PT Next Visit Plan  Progress neck strengthening and improving coordination    Consulted and Agree with Plan of Care  Patient       Patient will benefit from skilled therapeutic intervention in order to improve the following deficits and impairments:  Decreased endurance, Pain, Impaired sensation, Increased fascial restricitons, Decreased mobility, Decreased coordination, Increased muscle spasms, Decreased strength, Decreased range of motion, Hypomobility  Visit Diagnosis: Muscle weakness (generalized)  Radiculopathy, cervical region  Cervicalgia     Problem List There are no active problems to display for this patient.   Stephen Ryan, PT DPT 04/05/2017, 12:04 PM  [place] [facility] [address] [place], [name], [address] Phone: [phone]   Fax:  [phone]  Name: Stephen Ryan MRN: [id number] Date of Birth: 09/29/1960

## 2017-04-11 ENCOUNTER — Ambulatory Visit: Payer: Medicaid Other | Attending: Family Medicine

## 2017-04-11 DIAGNOSIS — M6281 Muscle weakness (generalized): Secondary | ICD-10-CM | POA: Insufficient documentation

## 2017-04-11 DIAGNOSIS — M5412 Radiculopathy, cervical region: Secondary | ICD-10-CM | POA: Diagnosis present

## 2017-04-11 DIAGNOSIS — M542 Cervicalgia: Secondary | ICD-10-CM | POA: Insufficient documentation

## 2017-04-11 NOTE — Therapy (Signed)
[facility] [address] [place], [name], [address] Phone: [phone]   Fax:  [phone]  Physical Therapy Treatment  Patient Details  Name: Stephen Ryan MRN: [id number] Date of Birth: 07/26/1960 Referring Provider: Cruzita LedererJudson, Melissa Erin   Encounter Date: 04/11/2017  PT End of Session - 04/11/17 1216    Visit Number  9    Number of Visits  12    Date for PT Re-Evaluation  05/03/17    Authorization Type  [employer]    PT Start Time  1120    PT Stop Time  1215    PT Time Calculation (min)  55 min    Activity Tolerance  Patient tolerated treatment well    Behavior During Therapy  [facility] for tasks assessed/performed       History reviewed. No pertinent past medical history.  Past Surgical History:  Procedure Laterality Date  . ANKLE SURGERY Right    cartilage damage  . EYE SURGERY    . FRACTURE SURGERY    . NASAL ENDOSCOPY    . VARICOCELE EXCISION      There were no vitals filed for this visit.  Subjective Assessment - 04/11/17 1206    Subjective  Patient reports he has a surgical consult in the beginning of the year. Patient reports the pain is about a 4/10 today and continues to have pain when driving and performing occupational tasks such as working on the computer.     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  Yes    Pain Score  4     Pain Location  Neck    Pain Orientation  Right;Left    Pain Descriptors / Indicators  Aching    Pain Onset  More than a month ago    Pain Frequency  Constant       TREATMENT Therapeutic Exercise: (All exercises performed with coordinated breathing) Scapular retraction in prone- x 15 with cueing to decrease shoulder elevation  Thoracic extension in prone -- x 15  with coordinated breathing Chest Press -- x 20 10# [facility] [place] in sitting -- x25 20# Scapular retraction in sititng with towel vertically along the back -- x 25 with RTB Thoracic extension in sitting with towel behind back -- x 20   Manual Therapy: STM to patient's cervical/thoracic extensors and retractors with patient positioned in proneto decrease increased pain and spasms in the area.  Cervical mobilizations grade II-III to decrease pain along the area and improve mobilization - 5 x 30 sec centrally  C5-T6  Patient reports aggravation of symptoms at end of the session     PT Education - 04/11/17 1216    Education provided  Yes    Education Details  Continue current HEP; Coordination of breathing with exercise performance    Person(s) Educated  Patient    Methods  Demonstration;Explanation    Comprehension  Verbalized understanding;Returned demonstration          PT Long Term Goals - 04/05/17 1159      PT LONG TERM GOAL #1   Title  Patient will be independent with HEP to continue benefits of therapy after discharge.    Baseline  Dependent with form/technique; 01/17/17: Moderate cueing on  technique/form; 02/28/17: moderate cueing and form; 03/22/2017: moderate cueing and form    Time  6    Period  Weeks    Status  On-going      PT LONG TERM GOAL #2   Title  Patient will improve NDI to under 20% to indicate significant improvement with neck pain with performing functional tasks    Baseline  52% NDI; 01/17/17: 38%; 02/28/2017: 34%; 03/22/2017: 50%; 04/05/2017: 46%    Time  6    Period  Weeks    Status  On-going      PT LONG TERM GOAL #3   Title  Patient will be able to type pain free for >1 hour or greater be able to perform occupational tasks.     Baseline  Unable to type without pain; 01/17/17: Can type for 15-8320min before onset of pain; 02/28/17: can type ~ 20 min before onset of pain; 03/22/2017: 45min; 04/05/2017    Time  6    Period  Weeks    Status   On-going            Plan - 04/11/17 1226    Clinical Impression Statement  Patient reports continuous increase in pain at rest. Patient demonstrates improved breathing technique with exercise performance indicating improvement in motor control. Patient demonstrates adequate AROM and strength but poor movement quality with poor control with performing coordinated movement. Patient will benefit from further skiled therapy focused on improving limitations to return to prior level of function.     Rehab Potential  Good    Clinical Impairments Affecting Rehab Potential  (+) highly motivated (-) chronic pain    PT Frequency  2x / week    PT Duration  6 weeks    PT Treatment/Interventions  Iontophoresis 4mg /ml Dexamethasone;Ultrasound;Moist Heat;Traction;Cryotherapy;Microbiologistlectrical Stimulation;Balance training;Therapeutic exercise;Therapeutic activities;Functional mobility training;Neuromuscular re-education;Patient/family education;Manual techniques;Dry needling    PT Next Visit Plan  Progress neck strengthening and improving coordination    Consulted and Agree with Plan of Care  Patient       Patient will benefit from skilled therapeutic intervention in order to improve the following deficits and impairments:  Decreased endurance, Pain, Impaired sensation, Increased fascial restricitons, Decreased mobility, Decreased coordination, Increased muscle spasms, Decreased strength, Decreased range of motion, Hypomobility  Visit Diagnosis: Cervicalgia  Radiculopathy, cervical region  Muscle weakness (generalized)     Problem List There are no active problems to display for this patient.   Myrene GalasWesley Audri Kozub, PT DPT 04/11/2017, 12:30 PM  [place] [facility] [address] [place], [name], [address] Phone: [phone]   Fax:  [phone]  Name: Stephen FrancoisKenneth Ryan MRN: [id number] Date of Birth: 09/30/1960

## 2017-04-24 ENCOUNTER — Ambulatory Visit: Payer: Medicaid Other

## 2017-04-24 DIAGNOSIS — M542 Cervicalgia: Secondary | ICD-10-CM

## 2017-04-24 DIAGNOSIS — M6281 Muscle weakness (generalized): Secondary | ICD-10-CM

## 2017-04-24 DIAGNOSIS — M5412 Radiculopathy, cervical region: Secondary | ICD-10-CM

## 2017-04-24 NOTE — Therapy (Signed)
[facility] [facility] [address] [place], [name], [address] Phone: [phone]   Fax:  [phone]  Physical Therapy Treatment  Patient Details  Name: Stephen Ryan MRN: [id number] Date of Birth: 09/23/1960 Referring Provider: Cruzita LedererJudson, Melissa Erin   Encounter Date: 04/24/2017  PT End of Session - 04/24/17 1808    Visit Number  10    Number of Visits  12    Date for PT Re-Evaluation  05/03/17    Authorization Type  [employer]    PT Start Time  1645    PT Stop Time  1730    PT Time Calculation (min)  45 min    Activity Tolerance  Patient tolerated treatment well    Behavior During Therapy  [facility] for tasks assessed/performed       History reviewed. No pertinent past medical history.  Past Surgical History:  Procedure Laterality Date  . ANKLE SURGERY Right    cartilage damage  . EYE SURGERY    . FRACTURE SURGERY    . NASAL ENDOSCOPY    . VARICOCELE EXCISION      There were no vitals filed for this visit.  Subjective Assessment - 04/24/17 1759    Subjective  Patient reports he has a surgical consult on the 4th of January of 2019. Patient reports his pain has not getting much better. Patient reports he felt "slighty looser" after the previous session.     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  Yes    Pain Score  4     Pain Location  Neck    Pain Orientation  Left;Right    Pain Descriptors / Indicators  Aching    Pain Type  Chronic pain    Pain Onset  More than a month ago    Pain Frequency  Constant       TREATMENT    Manual Therapy: STM to patient's cervical/thoracic extensors and retractors with patient positioned in proneto decrease increased pain and spasms in the area.  Cervical mobilizations grade  II-III to decrease pain along the area and improve mobilization - 5 x 30 sec centrally  C5-T6  PROM: cervical rotation, cervical lateral flexion, flexion/extension - 3 x 30sec each direction with patient in supine   Patient reports aggravation of symptoms at end of the session   PT Education - 04/24/17 1807    Education provided  Yes    Education Details  form/technique with exercise    Person(s) Educated  Patient    Methods  Explanation;Demonstration    Comprehension  Verbalized understanding;Returned demonstration          PT Long Term Goals - 04/05/17 1159      PT LONG TERM GOAL #1   Title  Patient will be independent with HEP to continue benefits of therapy after discharge.    Baseline  Dependent with form/technique; 01/17/17: Moderate cueing on technique/form; 02/28/17: moderate cueing and form; 03/22/2017: moderate cueing and form    Time  6    Period  Weeks    Status  On-going      PT LONG TERM GOAL #2   Title  Patient will improve NDI to under 20% to indicate significant improvement with neck pain with  performing functional tasks    Baseline  52% NDI; 01/17/17: 38%; 02/28/2017: 34%; 03/22/2017: 50%; 04/05/2017: 46%    Time  6    Period  Weeks    Status  On-going      PT LONG TERM GOAL #3   Title  Patient will be able to type pain free for >1 hour or greater be able to perform occupational tasks.     Baseline  Unable to type without pain; 01/17/17: Can type for 15-4120min before onset of pain; 02/28/17: can type ~ 20 min before onset of pain; 03/22/2017: 45min; 04/05/2017    Time  6    Period  Weeks    Status  On-going            Plan - 04/24/17 1808    Clinical Impression Statement  Patient demonstrates slight decrease in pain after performing manual therapy. Focused on manual therapy to help decrease pain and spasms. Educated patient following up with surgeon about symptoms, will continue therapy to help manage and decrease symptoms and pain.     Rehab Potential   Good    Clinical Impairments Affecting Rehab Potential  (+) highly motivated (-) chronic pain    PT Frequency  2x / week    PT Duration  6 weeks    PT Treatment/Interventions  Iontophoresis 4mg /ml Dexamethasone;Ultrasound;Moist Heat;Traction;Cryotherapy;Microbiologistlectrical Stimulation;Balance training;Therapeutic exercise;Therapeutic activities;Functional mobility training;Neuromuscular re-education;Patient/family education;Manual techniques;Dry needling    PT Next Visit Plan  Progress neck strengthening and improving coordination    Consulted and Agree with Plan of Care  Patient       Patient will benefit from skilled therapeutic intervention in order to improve the following deficits and impairments:  Decreased endurance, Pain, Impaired sensation, Increased fascial restricitons, Decreased mobility, Decreased coordination, Increased muscle spasms, Decreased strength, Decreased range of motion, Hypomobility  Visit Diagnosis: Muscle weakness (generalized)  Radiculopathy, cervical region  Cervicalgia     Problem List There are no active problems to display for this patient.   Myrene GalasWesley Jmarion Christiano, PT DPT 04/24/2017, 6:19 PM  [place] [facility] [facility] [address] [place], [name], [address] Phone: [phone]   Fax:  [phone]  Name: Stephen Ryan MRN: [id number] Date of Birth: 02/11/1961

## 2017-06-08 ENCOUNTER — Encounter: Payer: Self-pay | Admitting: *Deleted

## 2018-11-18 ENCOUNTER — Emergency Department
Admission: EM | Admit: 2018-11-18 | Discharge: 2018-11-19 | Disposition: A | Discharge status: Home / Self Care | Payer: Medicaid Other | Attending: Emergency Medicine | Admitting: Emergency Medicine

## 2018-11-18 ENCOUNTER — Emergency Department: Payer: Medicaid Other

## 2018-11-18 ENCOUNTER — Encounter: Payer: Self-pay | Admitting: Emergency Medicine

## 2018-11-18 ENCOUNTER — Other Ambulatory Visit: Payer: Self-pay

## 2018-11-18 DIAGNOSIS — Z20828 Contact with and (suspected) exposure to other viral communicable diseases: Secondary | ICD-10-CM | POA: Diagnosis not present

## 2018-11-18 DIAGNOSIS — R0602 Shortness of breath: Secondary | ICD-10-CM | POA: Diagnosis not present

## 2018-11-18 DIAGNOSIS — R197 Diarrhea, unspecified: Secondary | ICD-10-CM

## 2018-11-18 DIAGNOSIS — R112 Nausea with vomiting, unspecified: Secondary | ICD-10-CM | POA: Insufficient documentation

## 2018-11-18 DIAGNOSIS — Z87891 Personal history of nicotine dependence: Secondary | ICD-10-CM | POA: Diagnosis not present

## 2018-11-18 LAB — COMPREHENSIVE METABOLIC PANEL
ALT: 29 U/L (ref 0–44)
AST: 28 U/L (ref 15–41)
Albumin: 4.1 g/dL (ref 3.5–5.0)
Alkaline Phosphatase: 65 U/L (ref 38–126)
Anion gap: 11 (ref 5–15)
BUN: 18 mg/dL (ref 6–20)
CO2: 17 mmol/L — ABNORMAL LOW (ref 22–32)
Calcium: 9.7 mg/dL (ref 8.9–10.3)
Chloride: 110 mmol/L (ref 98–111)
Creatinine, Ser: 0.91 mg/dL (ref 0.61–1.24)
GFR calc Af Amer: >60 mL/min (ref >60)
GFR calc non Af Amer: >60 mL/min (ref >60)
Glucose, Bld: 107 mg/dL — ABNORMAL HIGH (ref 70–99)
Potassium: 3.8 mmol/L (ref 3.5–5.1)
Sodium: 138 mmol/L (ref 135–145)
Total Bilirubin: 1.4 mg/dL — ABNORMAL HIGH (ref 0.3–1.2)
Total Protein: 7 g/dL (ref 6.5–8.1)

## 2018-11-18 LAB — CBC
HCT: 50.9 % (ref 39.0–52.0)
Hemoglobin: 17.2 g/dL — ABNORMAL HIGH (ref 13.0–17.0)
MCH: 31.9 pg (ref 26.0–34.0)
MCHC: 33.8 g/dL (ref 30.0–36.0)
MCV: 94.4 fL (ref 80.0–100.0)
Platelets: 207 10*3/uL (ref 150–400)
RBC: 5.39 MIL/uL (ref 4.22–5.81)
RDW: 12.8 % (ref 11.5–15.5)
WBC: 10.7 10*3/uL — ABNORMAL HIGH (ref 4.0–10.5)
nRBC: 0 % (ref 0.0–0.2)

## 2018-11-18 LAB — LIPASE, BLOOD: Lipase: 22 U/L (ref 11–51)

## 2018-11-18 LAB — SARS CORONAVIRUS 2 BY RT PCR (HOSPITAL ORDER, PERFORMED IN ~~LOC~~ HOSPITAL LAB): SARS Coronavirus 2: NEGATIVE

## 2018-11-18 MED ORDER — SODIUM CHLORIDE 0.9 % IV BOLUS
1000.0000 mL | Freq: Once | INTRAVENOUS | Status: AC
  Administered 2018-11-18: 22:00:00 1000 mL via INTRAVENOUS

## 2018-11-18 MED ORDER — SODIUM CHLORIDE 0.9% FLUSH: 3.0000 mL | Freq: Once | INTRAVENOUS | Status: DC

## 2018-11-18 MED ORDER — ONDANSETRON HCL 4 MG/2ML IJ SOLN
4.0000 mg | Freq: Once | INTRAMUSCULAR | Status: AC
  Administered 2018-11-18: 22:00:00 4 mg via INTRAVENOUS
  Filled 2018-11-18: qty 2

## 2018-11-18 NOTE — ED Triage Notes (Signed)
First Nurse Note:  Arrives from [facility] for ED evaluation.  Patient with N/V/D, onset last ngiht.  Per [facility], entire family is ill.  While at [facility], patient was tachycardic and orthostatic.  Patient arrives AAOx3.  Skin warm and dry. NAD

## 2018-11-18 NOTE — ED Triage Notes (Signed)
Pt from [facility] with c/o poss covid. Pt states he and his family have had + vomiting, diarrhea, body aches and fever since yesterday. PT in NAD, denies SOB

## 2018-11-18 NOTE — ED Notes (Signed)
No answer when called several times from lobby 

## 2018-11-18 NOTE — ED Notes (Signed)
Patient have been having nausea and vomiting along with diarrhea. Vomiting x 8 and diarrhea x 8 today. Abdominal pain 4/10 no increase pain with palpation.

## 2018-11-18 NOTE — ED Provider Notes (Signed)
[facility] Emergency Department Provider Note  Time seen: 9:49 PM  I have reviewed the triage vital signs and the nursing notes.   HISTORY  Chief Complaint Emesis   HPI Stephen Ryan is a 58 y.o. male with no significant past medical history presents emergency department for nausea vomiting diarrhea cough and shortness of breath.  According to the patient 2 days ago his daughter became ill with symptoms of fever, mild shortness of breath vomiting and diarrhea.  States the following day his wife became sick with symptoms and last night he came down with symptoms.  States he has had diarrhea all night has been vomiting has felt short of breath with occasional coughing episodes.  Has had subjective fevers as well.  Denies any chest pain.  Denies any abdominal pain.  History reviewed. No pertinent past medical history.  There are no active problems to display for this patient.   Past Surgical History:  Procedure Laterality Date  . ANKLE SURGERY Right    cartilage damage  . EYE SURGERY    . FRACTURE SURGERY    . NASAL ENDOSCOPY    . VARICOCELE EXCISION      Prior to Admission medications   Medication Sig Start Date End Date Taking? Authorizing Provider  acetaminophen (TYLENOL) 500 MG chewable tablet Chew 500 mg by mouth every 6 (six) hours as needed for pain.    [provider]  ibuprofen (ADVIL,MOTRIN) 200 MG tablet Take 200 mg by mouth every 6 (six) hours as needed.    [provider]  predniSONE (STERAPRED UNI-PAK 21 TAB) 10 MG (21) TBPK tablet Take by mouth daily. Take 6 tablets the first day, take 5 tablets the second day, take 4 tablets the third day, take 3 tablets the fourth day, take 2 tablets the fifth day, take 1 tablet the sixth day. Patient not taking: Reported on 11/30/2016 09/11/16   Lannie Fields, PA-C    No Known Allergies  No family history on file.  Social History Social History   Tobacco Use  . Smoking status:  Former Research scientist (life sciences)  . Smokeless tobacco: Never Used  Substance Use Topics  . Alcohol use: Yes  . Drug use: Not on file    Review of Systems Constitutional: Subjective fever Cardiovascular: Negative for chest pain. Respiratory: Positive for shortness of breath.  Positive for cough. Gastrointestinal: Negative for abdominal pain.  Positive for nausea vomiting diarrhea. Musculoskeletal: Negative for musculoskeletal complaints Skin: Negative for skin complaints  Neurological: Negative for headache All other ROS negative  ____________________________________________   PHYSICAL EXAM:  VITAL SIGNS: ED Triage Vitals  Enc Vitals Group     BP 11/18/18 1546 (!) 139/92     Pulse Rate 11/18/18 1540 (!) 110     Resp 11/18/18 1540 16     Temp 11/18/18 1540 99.5 F (37.5 C)     Temp Source 11/18/18 1540 Oral     SpO2 11/18/18 1540 97 %     Weight --      Height --      Head Circumference --      Peak Flow --      Pain Score 11/18/18 1540 5     Pain Loc --      Pain Edu? --      Excl. in [place]? --    Constitutional: Alert and oriented. Well appearing and in no distress. Eyes: Normal exam ENT      Head: Normocephalic and atraumatic  Mouth/Throat: Mucous membranes are moist. Cardiovascular: Normal rate, regular rhythm.  Respiratory: Normal respiratory effort without tachypnea nor retractions. Breath sounds are clear  Gastrointestinal: Soft and nontender. No distention.   Musculoskeletal: Nontender with normal range of motion in all extremities.  Neurologic:  Normal speech and language. No gross focal neurologic deficits  Skin:  Skin is warm, dry and intact.  Psychiatric: Mood and affect are normal.   ____________________________________________    RADIOLOGY  Chest x-ray my evaluation does not appear to show any significant abnormality.  Official radiology read pending  ____________________________________________   INITIAL IMPRESSION / ASSESSMENT AND PLAN / ED  COURSE  Pertinent labs & imaging results that were available during my care of the patient were reviewed by me and considered in my medical decision making (see chart for details).   Patient presents emergency department for symptoms of subjective fever nausea vomiting diarrhea cough and shortness of breath.  Differential would include viral illness such as coronavirus or norovirus, gastroenteritis.  Labs are largely within normal limits.  We will obtain a stool sample to check for norovirus, we will swab for coronavirus and obtain a chest x-ray.  Patient agreeable to plan of care.  We will IV hydrate treat nausea while awaiting results.  Labs are largely within normal limits.  Chest x-ray appears clear radiology read pending.  Corona swab pending.  Patient receiving IV fluids and Zofran.  Patient care signed out to oncoming physician. Kennieth FrancoisKenneth Uhls was evaluated in Emergency Department on 11/18/2018 for the symptoms described in the history of present illness. He was evaluated in the context of the global COVID-19 pandemic, which necessitated consideration that the patient might be at risk for infection with the SARS-CoV-2 virus that causes COVID-19. Institutional protocols and algorithms that pertain to the evaluation of patients at risk for COVID-19 are in a state of rapid change based on information released by regulatory bodies including the CDC and federal and state organizations. These policies and algorithms were followed during the patient's care in the ED.  ____________________________________________   FINAL CLINICAL IMPRESSION(S) / ED DIAGNOSES  Fever Nausea vomiting diarrhea Cough   Minna AntisPaduchowski, Ari Bernabei, MD 11/18/18 2244

## 2018-11-19 LAB — URINALYSIS, COMPLETE (UACMP) WITH MICROSCOPIC
Bacteria, UA: NONE SEEN
Bilirubin Urine: NEGATIVE
Glucose, UA: NEGATIVE mg/dL
Hgb urine dipstick: NEGATIVE
Ketones, ur: 20 mg/dL — AB
Leukocytes,Ua: NEGATIVE
Nitrite: NEGATIVE
Protein, ur: NEGATIVE mg/dL
Specific Gravity, Urine: 1.02 (ref 1.005–1.030)
Squamous Epithelial / LPF: NONE SEEN (ref 0–5)
pH: 5 (ref 5.0–8.0)

## 2018-11-19 MED ORDER — ONDANSETRON 4 MG PO TBDP: 4.0000 mg | ORAL_TABLET | Freq: Three times a day (TID) | ORAL | 0 refills | Status: AC | PRN

## 2018-11-19 NOTE — Discharge Instructions (Addendum)
1.  You may take Zofran as needed for nausea/vomiting. 2.  Eat a clear liquid diet x12 hours, then [employer] x3 days, then slowly advance diet as tolerated. 3.  Try to quarantine yourself from the rest of your household until they receive their COVID results. 4.  You may return stool specimens at your convenience to the outpatient laboratory. 5.  Return to the ER for worsening symptoms, persistent vomiting, difficulty breathing or other concerns.

## 2018-11-19 NOTE — ED Provider Notes (Signed)
-----------------------------------------   12:45 AM on 11/19/2018 -----------------------------------------  Had updated patient of all lab results and he provided urine specimen.  Has not yet been able to stool in the ED.  Feeling significantly better and eager for discharge home.  Has tolerated PO without emesis.  Will discharge home with stool collection kit.  Strict return precautions given.  Patient verbalizes understanding agrees with plan of care.   Paulette Blanch, MD 11/19/18 [phone]

## 2018-11-20 ENCOUNTER — Telehealth: Payer: Self-pay | Admitting: Emergency Medicine

## 2018-11-20 ENCOUNTER — Other Ambulatory Visit
Admission: RE | Admit: 2018-11-20 | Discharge: 2018-11-20 | Disposition: A | Discharge status: Home / Self Care | Payer: Medicaid Other | Source: Ambulatory Visit | Attending: Emergency Medicine | Admitting: Emergency Medicine

## 2018-11-20 DIAGNOSIS — R197 Diarrhea, unspecified: Secondary | ICD-10-CM | POA: Insufficient documentation

## 2018-11-20 LAB — C DIFFICILE QUICK SCREEN W PCR REFLEX
C Diff antigen: NEGATIVE
C Diff interpretation: NOT DETECTED
C Diff toxin: NEGATIVE

## 2018-11-20 LAB — LACTOFERRIN, FECAL, QUALITATIVE: Lactoferrin, Fecal, Qual: POSITIVE — AB

## 2018-11-20 LAB — GASTROINTESTINAL PANEL BY PCR, STOOL (REPLACES STOOL CULTURE)

## 2018-11-20 NOTE — Telephone Encounter (Signed)
Spoke with Dr.Stafford , in reference to + GI PCR (+ Norovirus).  Called patient to inform of + results and to instruct pt to continue with hand hygiene, pt verbalized understanding.  Instructed pt if symptoms worsen to return to ED

## 2019-05-21 ENCOUNTER — Ambulatory Visit: Payer: Medicaid Other | Attending: Internal Medicine

## 2019-05-21 DIAGNOSIS — Z20822 Contact with and (suspected) exposure to covid-19: Secondary | ICD-10-CM

## 2019-05-22 LAB — NOVEL CORONAVIRUS, NAA: SARS-CoV-2, NAA: DETECTED — AB

## 2019-09-01 ENCOUNTER — Ambulatory Visit: Payer: Medicaid Other | Attending: Internal Medicine

## 2019-09-01 DIAGNOSIS — Z23 Encounter for immunization: Secondary | ICD-10-CM

## 2019-09-01 NOTE — Progress Notes (Signed)
   Covid-19 Vaccination Clinic  Name:  Gotti Alwin    MRN: [id number] DOB: 24-Sep-1960  09/01/2019  Mr. Hlavaty was observed post Covid-19 immunization for 15 minutes without incident. He was provided with Vaccine Information Sheet and instruction to access the V-Safe system.   Mr. Ogas was instructed to call 911 with any severe reactions post vaccine: Marland Kitchen Difficulty breathing  . Swelling of face and throat  . A fast heartbeat  . A bad rash all over body  . Dizziness and weakness   Immunizations Administered    Name Date Dose VIS Date Route   Pfizer COVID-19 Vaccine 09/01/2019 11:51 AM 0.3 mL 07/02/2018 Intramuscular   Manufacturer: [employer], [employer]   Lot: [id number]   NDC: [address]-5

## 2019-09-23 ENCOUNTER — Ambulatory Visit: Payer: Medicaid Other | Attending: Internal Medicine

## 2019-09-23 DIAGNOSIS — Z23 Encounter for immunization: Secondary | ICD-10-CM

## 2019-09-23 NOTE — Progress Notes (Signed)
   Covid-19 Vaccination Clinic  Name:  Stephen Ryan    MRN: [id number] DOB: 1960/07/15  09/23/2019  Stephen Ryan was observed post Covid-19 immunization for 15 minutes without incident. He was provided with Vaccine Information Sheet and instruction to access the V-Safe system.   Stephen Ryan was instructed to call 911 with any severe reactions post vaccine: Marland Kitchen Difficulty breathing  . Swelling of face and throat  . A fast heartbeat  . A bad rash all over body  . Dizziness and weakness   Immunizations Administered    Name Date Dose VIS Date Route   Pfizer COVID-19 Vaccine 09/23/2019 10:56 AM 0.3 mL 07/02/2018 Intramuscular   Manufacturer: [employer], [employer]   Lot: [id number]   NDC: [address]-9

## 2021-04-16 IMAGING — DX PORTABLE CHEST - 1 VIEW
1 series · 1 of 1 positions shown · non-contrast
Comparison: None.

CLINICAL DATA: Possible [id number].  Shortness of breath, fever

EXAM:
PORTABLE CHEST 1 VIEW

[chest ap]
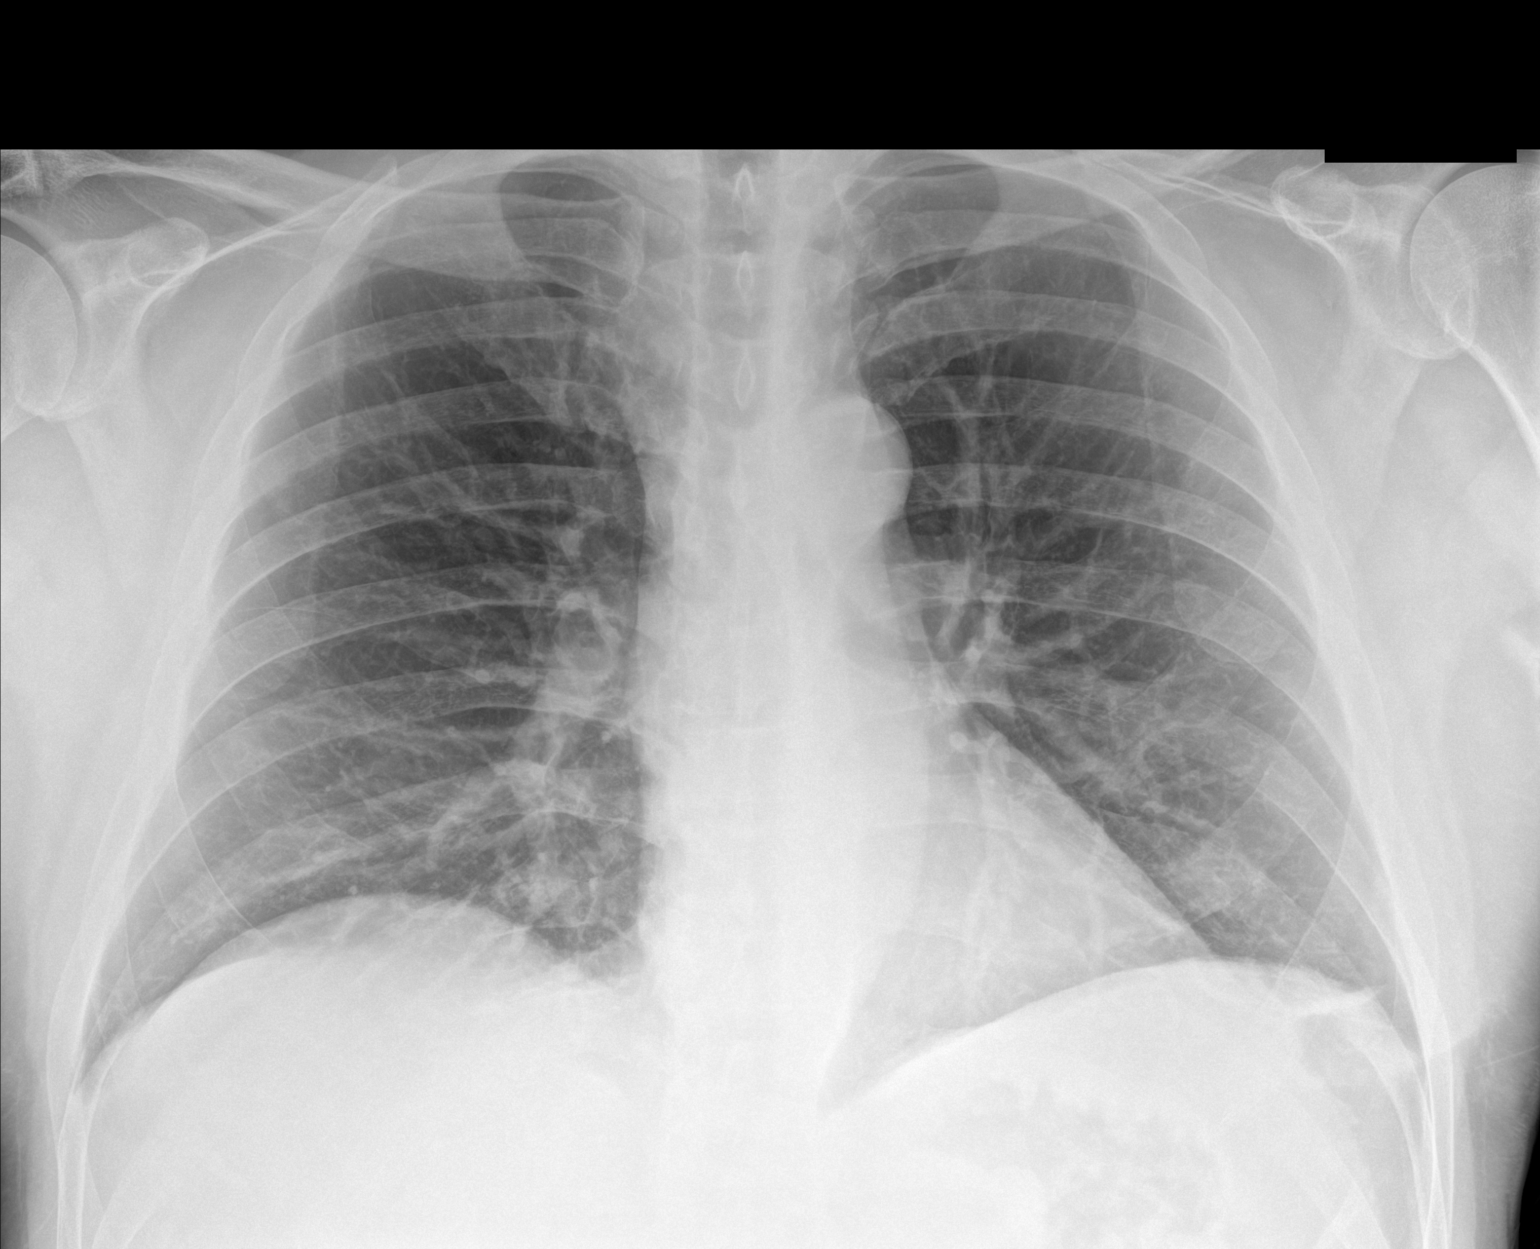

[1 of 1 positions shown; findings below may reference images not displayed]

FINDINGS: Linear atelectasis at the left base. Right lung clear. Heart is
normal size. No effusions or acute bony abnormality.
IMPRESSION: Minimal subsegmental left base atelectasis.  No acute findings.

## 2021-10-11 ENCOUNTER — Ambulatory Visit
Admission: EM | Admit: 2021-10-11 | Discharge: 2021-10-11 | Disposition: A | Discharge status: Home / Self Care | Payer: Medicaid Other | Attending: Family Medicine | Admitting: Family Medicine

## 2021-10-11 ENCOUNTER — Encounter: Payer: Self-pay | Admitting: Emergency Medicine

## 2021-10-11 DIAGNOSIS — J0140 Acute pansinusitis, unspecified: Secondary | ICD-10-CM

## 2021-10-11 MED ORDER — AMOXICILLIN-POT CLAVULANATE 875-125 MG PO TABS: 1.0000 | ORAL_TABLET | Freq: Two times a day (BID) | ORAL | 0 refills | Status: AC

## 2021-10-11 MED ORDER — PROMETHAZINE-DM 6.25-15 MG/5ML PO SYRP: 5.0000 mL | ORAL_SOLUTION | Freq: Four times a day (QID) | ORAL | 0 refills | Status: AC | PRN

## 2021-10-11 NOTE — Discharge Instructions (Addendum)
Cough medications are often not covered by insurance, you can purchase out of pocket or purchase over the counter Delsym for cough.  Recommend Claritin (antihistamine) take for the next 2 weeks or until nasal symptoms completely resolve.   Complete entire course of antibiotics

## 2021-10-11 NOTE — ED Triage Notes (Signed)
Pt c/o cough, scratchy throat, sinus pressure, runny nose, fatigue and sneezing x 9 days. Pt states he was in the bed for 3 days. His symptoms are improving but he still not 100% better.

## 2021-10-16 NOTE — ED Provider Notes (Signed)
Stephen Ryan    CSN: [id number] Arrival date & time: 10/11/21  1712      History   Chief Complaint Chief Complaint  Patient presents with   Cough   Nasal Congestion   Fatigue    HPI Stephen Ryan is a 61 y.o. male.   HPI Patient presents for evaluation of sore throat, sinus pressure, runny nose, and fatigue, and sneezing x 9 days. He reports symptoms have not resolved despite rest and OTC medications.  He denies fever. He has a cough which is non-productive attributes cough to continues post nasal drainage. He denies wheezing or shortness of breath. No history of chronic respiratory disease.  History reviewed. No pertinent past medical history.  There are no problems to display for this patient.   Past Surgical History:  Procedure Laterality Date   ANKLE SURGERY Right    cartilage damage   EYE SURGERY     FRACTURE SURGERY     NASAL ENDOSCOPY     VARICOCELE EXCISION         Home Medications    Prior to Admission medications   Medication Sig Start Date End Date Taking? Authorizing Provider  amoxicillin-clavulanate (AUGMENTIN) 875-125 MG tablet Take 1 tablet by mouth 2 (two) times daily. 10/11/21  Yes Scot Jun, FNP  promethazine-dextromethorphan (PROMETHAZINE-DM) 6.25-15 MG/5ML syrup Take 5 mLs by mouth 4 (four) times daily as needed for cough. 10/11/21  Yes Scot Jun, FNP  acetaminophen (TYLENOL) 500 MG chewable tablet Chew 500 mg by mouth every 6 (six) hours as needed for pain.    [provider]  ibuprofen (ADVIL,MOTRIN) 200 MG tablet Take 200 mg by mouth every 6 (six) hours as needed.    [provider]  ondansetron (ZOFRAN ODT) 4 MG disintegrating tablet Take 1 tablet (4 mg total) by mouth every 8 (eight) hours as needed for nausea or vomiting. 11/19/18   Paulette Blanch, MD  predniSONE (STERAPRED UNI-PAK 21 TAB) 10 MG (21) TBPK tablet Take by mouth daily. Take 6 tablets the first day, take 5 tablets the second day, take 4  tablets the third day, take 3 tablets the fourth day, take 2 tablets the fifth day, take 1 tablet the sixth day. Patient not taking: Reported on 11/30/2016 09/11/16   Lannie Fields, PA-C    Family History History reviewed. No pertinent family history.  Social History Social History   Tobacco Use   Smoking status: Former   Smokeless tobacco: Never  Scientific laboratory technician Use: Never used  Substance Use Topics   Alcohol use: Yes   Drug use: Never     Allergies   Patient has no known allergies.   Review of Systems Review of Systems Pertinent negatives listed in HPI   Physical Exam Triage Vital Signs ED Triage Vitals [10/11/21 1746]  Enc Vitals Group     BP 129/82     Pulse Rate 98     Resp 16     Temp 98 F (36.7 C)     Temp Source Oral     SpO2 95 %     Weight      Height      Head Circumference      Peak Flow      Pain Score 0     Pain Loc      Pain Edu?      Excl. in [place]?    No data found.  Updated Vital Signs BP 129/82 (BP  Location: Left Arm)   Pulse 98   Temp 98 F (36.7 C) (Oral)   Resp 16   SpO2 95%   Visual Acuity Right Eye Distance:   Left Eye Distance:   Bilateral Distance:    Right Eye Near:   Left Eye Near:    Bilateral Near:     Physical Exam Constitutional:      Appearance: He is ill-appearing.  HENT:     Head: Normocephalic and atraumatic.     Right Ear: Tympanic membrane, ear canal and external ear normal.     Left Ear: Tympanic membrane, ear canal and external ear normal.     Nose: Mucosal edema, congestion and rhinorrhea present.     Mouth/Throat:     Mouth: Mucous membranes are moist.  Eyes:     Extraocular Movements: Extraocular movements intact.     Pupils: Pupils are equal, round, and reactive to light.  Cardiovascular:     Rate and Rhythm: Normal rate and regular rhythm.  Pulmonary:     Effort: Pulmonary effort is normal.     Breath sounds: Normal breath sounds.  Lymphadenopathy:     Cervical: Cervical adenopathy  present.  Skin:    General: Skin is warm and dry.     Capillary Refill: Capillary refill takes less than 2 seconds.  Neurological:     General: No focal deficit present.     Mental Status: He is alert.  Psychiatric:        Mood and Affect: Mood normal.        Behavior: Behavior normal.      UC Treatments / Results  Labs (all labs ordered are listed, but only abnormal results are displayed) Labs Reviewed - No data to display  EKG   Radiology No results found.  Procedures Procedures (including critical care time)  Medications Ordered in UC Medications - No data to display  Initial Impression / Assessment and Plan / UC Course  I have reviewed the triage vital signs and the nursing notes.  Pertinent labs & imaging results that were available during my care of the patient were reviewed by me and considered in my medical decision making (see chart for details).    Acute non-recurrent pansinusitis Treatment per medication discharges orders and discharge instructions. If symptoms worsen or do not improve return for evaluation. ER if symptoms becomes severe. Follow-up with PCP or return as needed. Final Clinical Impressions(s) / UC Diagnoses   Final diagnoses:  Acute non-recurrent pansinusitis     Discharge Instructions      Cough medications are often not covered by insurance, you can purchase out of pocket or purchase over the counter Delsym for cough.  Recommend Claritin (antihistamine) take for the next 2 weeks or until nasal symptoms completely resolve.   Complete entire course of antibiotics     ED Prescriptions     Medication Sig Dispense Auth. Provider   amoxicillin-clavulanate (AUGMENTIN) 875-125 MG tablet Take 1 tablet by mouth 2 (two) times daily. 20 tablet Scot Jun, FNP   promethazine-dextromethorphan (PROMETHAZINE-DM) 6.25-15 MG/5ML syrup Take 5 mLs by mouth 4 (four) times daily as needed for cough. 180 mL Scot Jun, FNP       PDMP not reviewed this encounter.   Scot Jun, FNP 10/16/21 2136
